# Patient Record
Sex: Male | Born: 1940 | Race: White | Hispanic: No | Marital: Married | Smoking: Never smoker
Health system: Southern US, Community
[De-identification: ages and names within clinical notes are randomized; demographics above are authoritative.]

## PROBLEM LIST (undated history)

## (undated) DIAGNOSIS — C679 Malignant neoplasm of bladder, unspecified: Secondary | ICD-10-CM

## (undated) DIAGNOSIS — Z8719 Personal history of other diseases of the digestive system: Secondary | ICD-10-CM

## (undated) DIAGNOSIS — I8390 Asymptomatic varicose veins of unspecified lower extremity: Secondary | ICD-10-CM

## (undated) DIAGNOSIS — I493 Ventricular premature depolarization: Secondary | ICD-10-CM

## (undated) DIAGNOSIS — Z87442 Personal history of urinary calculi: Secondary | ICD-10-CM

## (undated) HISTORY — PX: TRANSURETHRAL RESECTION OF PROSTATE: SHX73

## (undated) HISTORY — PX: ENDOSCOPIC LIGATION SAPHENOUS VEIN: SUR441

## (undated) HISTORY — PX: COLONOSCOPY: SHX174

## (undated) HISTORY — PX: TONSILLECTOMY AND ADENOIDECTOMY: SUR1326

---

## 2002-05-29 HISTORY — PX: HERNIA REPAIR: SHX51

## 2017-11-14 ENCOUNTER — Emergency Department (HOSPITAL_COMMUNITY): Payer: Medicare Other | Admitting: Anesthesiology

## 2017-11-14 ENCOUNTER — Encounter (HOSPITAL_COMMUNITY): Payer: Self-pay | Admitting: Emergency Medicine

## 2017-11-14 ENCOUNTER — Emergency Department (HOSPITAL_COMMUNITY): Payer: Medicare Other

## 2017-11-14 ENCOUNTER — Encounter (HOSPITAL_COMMUNITY)
Admission: EM | Disposition: A | Discharge status: Home / Self Care | Payer: Self-pay | Source: Home / Self Care | Attending: Emergency Medicine

## 2017-11-14 ENCOUNTER — Emergency Department (HOSPITAL_COMMUNITY)
Admission: EM | Admit: 2017-11-14 | Discharge: 2017-11-14 | Disposition: A | Discharge status: Home / Self Care | Payer: Medicare Other | Attending: Emergency Medicine | Admitting: Emergency Medicine

## 2017-11-14 DIAGNOSIS — Z79899 Other long term (current) drug therapy: Secondary | ICD-10-CM | POA: Insufficient documentation

## 2017-11-14 DIAGNOSIS — N2889 Other specified disorders of kidney and ureter: Secondary | ICD-10-CM | POA: Diagnosis present

## 2017-11-14 DIAGNOSIS — N132 Hydronephrosis with renal and ureteral calculous obstruction: Secondary | ICD-10-CM

## 2017-11-14 HISTORY — PX: CYSTOSCOPY W/ URETERAL STENT PLACEMENT: SHX1429

## 2017-11-14 HISTORY — DX: Malignant neoplasm of bladder, unspecified: C67.9

## 2017-11-14 LAB — COMPREHENSIVE METABOLIC PANEL
ALT: 18 U/L (ref 17–63)
AST: 24 U/L (ref 15–41)
Albumin: 4.6 g/dL (ref 3.5–5.0)
Alkaline Phosphatase: 66 U/L (ref 38–126)
Anion gap: 12 (ref 5–15)
BUN: 24 mg/dL — AB (ref 6–20)
CHLORIDE: 101 mmol/L (ref 101–111)
CO2: 28 mmol/L (ref 22–32)
CREATININE: 1.13 mg/dL (ref 0.61–1.24)
Calcium: 9.3 mg/dL (ref 8.9–10.3)
GFR calc Af Amer: >60 mL/min (ref >60)
GFR calc non Af Amer: >60 mL/min (ref >60)
Glucose, Bld: 122 mg/dL — ABNORMAL HIGH (ref 65–99)
POTASSIUM: 5 mmol/L (ref 3.5–5.1)
SODIUM: 141 mmol/L (ref 135–145)
Total Bilirubin: 1.9 mg/dL — ABNORMAL HIGH (ref 0.3–1.2)
Total Protein: 7.2 g/dL (ref 6.5–8.1)

## 2017-11-14 LAB — CBC WITH DIFFERENTIAL/PLATELET
BASOS ABS: 0 10*3/uL (ref 0.0–0.1)
Basophils Relative: 0 %
EOS ABS: 0 10*3/uL (ref 0.0–0.7)
EOS PCT: 1 %
HCT: 49.9 % (ref 39.0–52.0)
Hemoglobin: 17.3 g/dL — ABNORMAL HIGH (ref 13.0–17.0)
LYMPHS PCT: 7 %
Lymphs Abs: 0.5 10*3/uL — ABNORMAL LOW (ref 0.7–4.0)
MCH: 33.2 pg (ref 26.0–34.0)
MCHC: 34.7 g/dL (ref 30.0–36.0)
MCV: 95.8 fL (ref 78.0–100.0)
Monocytes Absolute: 1 10*3/uL (ref 0.1–1.0)
Monocytes Relative: 14 %
Neutro Abs: 5.7 10*3/uL (ref 1.7–7.7)
Neutrophils Relative %: 78 %
PLATELETS: 193 10*3/uL (ref 150–400)
RBC: 5.21 MIL/uL (ref 4.22–5.81)
RDW: 13.5 % (ref 11.5–15.5)
WBC: 7.2 10*3/uL (ref 4.0–10.5)

## 2017-11-14 LAB — URINALYSIS, ROUTINE W REFLEX MICROSCOPIC
BILIRUBIN URINE: NEGATIVE
Bacteria, UA: NONE SEEN
GLUCOSE, UA: NEGATIVE mg/dL
Ketones, ur: 5 mg/dL — AB
Nitrite: NEGATIVE
PH: 5 (ref 5.0–8.0)
PROTEIN: NEGATIVE mg/dL
Specific Gravity, Urine: 1.013 (ref 1.005–1.030)

## 2017-11-14 LAB — LIPASE, BLOOD: Lipase: 111 U/L — ABNORMAL HIGH (ref 11–51)

## 2017-11-14 SURGERY — CYSTOSCOPY, FLEXIBLE, WITH STENT REPLACEMENT
Anesthesia: General | Laterality: Right

## 2017-11-14 MED ORDER — OXYCODONE HCL 5 MG PO CAPS: 5.0000 mg | ORAL_CAPSULE | ORAL | 0 refills | Status: DC | PRN

## 2017-11-14 MED ORDER — SODIUM CHLORIDE 0.9 % IR SOLN
Status: DC | PRN
  Administered 2017-11-14: 3000 mL via INTRAVESICAL

## 2017-11-14 MED ORDER — CEFAZOLIN SODIUM-DEXTROSE 2-3 GM-%(50ML) IV SOLR
INTRAVENOUS | Status: DC | PRN
  Administered 2017-11-14: 2 g via INTRAVENOUS

## 2017-11-14 MED ORDER — PHENYLEPHRINE 40 MCG/ML (10ML) SYRINGE FOR IV PUSH (FOR BLOOD PRESSURE SUPPORT)
PREFILLED_SYRINGE | INTRAVENOUS | Status: AC
  Filled 2017-11-14: qty 10

## 2017-11-14 MED ORDER — IOHEXOL 300 MG/ML  SOLN
INTRAMUSCULAR | Status: DC | PRN
  Administered 2017-11-14: 40 mL via URETHRAL

## 2017-11-14 MED ORDER — LIDOCAINE 2% (20 MG/ML) 5 ML SYRINGE
INTRAMUSCULAR | Status: DC | PRN
  Administered 2017-11-14: 80 mg via INTRAVENOUS

## 2017-11-14 MED ORDER — ONDANSETRON HCL 4 MG/2ML IJ SOLN
INTRAMUSCULAR | Status: DC | PRN
  Administered 2017-11-14: 4 mg via INTRAVENOUS

## 2017-11-14 MED ORDER — DEXAMETHASONE SODIUM PHOSPHATE 4 MG/ML IJ SOLN
INTRAMUSCULAR | Status: DC | PRN
  Administered 2017-11-14: 10 mg via INTRAVENOUS

## 2017-11-14 MED ORDER — SUCCINYLCHOLINE CHLORIDE 200 MG/10ML IV SOSY
PREFILLED_SYRINGE | INTRAVENOUS | Status: DC | PRN
  Administered 2017-11-14 (×2): 100 mg via INTRAVENOUS

## 2017-11-14 MED ORDER — SUCCINYLCHOLINE CHLORIDE 200 MG/10ML IV SOSY
PREFILLED_SYRINGE | INTRAVENOUS | Status: AC
  Filled 2017-11-14: qty 10

## 2017-11-14 MED ORDER — SODIUM CHLORIDE 0.9 % IV BOLUS
500.0000 mL | Freq: Once | INTRAVENOUS | Status: AC
  Administered 2017-11-14: 500 mL via INTRAVENOUS

## 2017-11-14 MED ORDER — PROPOFOL 10 MG/ML IV BOLUS
INTRAVENOUS | Status: AC
  Filled 2017-11-14: qty 20

## 2017-11-14 MED ORDER — CEFAZOLIN SODIUM-DEXTROSE 2-4 GM/100ML-% IV SOLN
INTRAVENOUS | Status: AC
  Filled 2017-11-14: qty 100

## 2017-11-14 MED ORDER — PROPOFOL 10 MG/ML IV BOLUS
INTRAVENOUS | Status: DC | PRN
  Administered 2017-11-14: 120 mg via INTRAVENOUS
  Administered 2017-11-14: 80 mg via INTRAVENOUS

## 2017-11-14 MED ORDER — PHENYLEPHRINE 40 MCG/ML (10ML) SYRINGE FOR IV PUSH (FOR BLOOD PRESSURE SUPPORT)
PREFILLED_SYRINGE | INTRAVENOUS | Status: DC | PRN
  Administered 2017-11-14 (×3): 80 ug via INTRAVENOUS

## 2017-11-14 MED ORDER — FENTANYL CITRATE (PF) 100 MCG/2ML IJ SOLN
25.0000 ug | INTRAMUSCULAR | Status: DC | PRN
  Administered 2017-11-14 (×2): 50 ug via INTRAVENOUS

## 2017-11-14 MED ORDER — LIDOCAINE 2% (20 MG/ML) 5 ML SYRINGE
INTRAMUSCULAR | Status: AC
  Filled 2017-11-14: qty 5

## 2017-11-14 MED ORDER — PROMETHAZINE HCL 25 MG/ML IJ SOLN: 6.2500 mg | INTRAMUSCULAR | Status: DC | PRN

## 2017-11-14 MED ORDER — LACTATED RINGERS IV SOLN
INTRAVENOUS | Status: DC | PRN
  Administered 2017-11-14: 21:00:00 via INTRAVENOUS

## 2017-11-14 MED ORDER — FENTANYL CITRATE (PF) 100 MCG/2ML IJ SOLN
INTRAMUSCULAR | Status: AC
  Filled 2017-11-14: qty 2

## 2017-11-14 MED ORDER — ACETAMINOPHEN 10 MG/ML IV SOLN: 1000.0000 mg | Freq: Once | INTRAVENOUS | Status: DC | PRN

## 2017-11-14 MED ORDER — OXYCODONE HCL 5 MG/5ML PO SOLN
5.0000 mg | Freq: Once | ORAL | Status: DC | PRN
  Filled 2017-11-14: qty 5

## 2017-11-14 MED ORDER — OXYCODONE HCL 5 MG PO TABS: 5.0000 mg | ORAL_TABLET | Freq: Once | ORAL | Status: DC | PRN

## 2017-11-14 MED ORDER — ONDANSETRON HCL 4 MG/2ML IJ SOLN
INTRAMUSCULAR | Status: AC
  Filled 2017-11-14: qty 2

## 2017-11-14 MED ORDER — DEXAMETHASONE SODIUM PHOSPHATE 10 MG/ML IJ SOLN
INTRAMUSCULAR | Status: AC
  Filled 2017-11-14: qty 1

## 2017-11-14 SURGICAL SUPPLY — 14 items
BAG URO CATCHER STRL LF (MISCELLANEOUS) ×3 IMPLANT
CATH INTERMIT  6FR 70CM (CATHETERS) ×3 IMPLANT
CLOTH BEACON ORANGE TIMEOUT ST (SAFETY) IMPLANT
COVER FOOTSWITCH UNIV (MISCELLANEOUS) IMPLANT
COVER SURGICAL LIGHT HANDLE (MISCELLANEOUS) IMPLANT
GLOVE BIOGEL M STRL SZ7.5 (GLOVE) ×9 IMPLANT
GOWN STRL REUS W/TWL LRG LVL3 (GOWN DISPOSABLE) ×6 IMPLANT
GUIDEWIRE STR DUAL SENSOR (WIRE) ×6 IMPLANT
GUIDEWIRE ZIPWRE .038 STRAIGHT (WIRE) ×3 IMPLANT
MANIFOLD NEPTUNE II (INSTRUMENTS) ×3 IMPLANT
PACK CYSTO (CUSTOM PROCEDURE TRAY) ×3 IMPLANT
STENT CONTOUR 6FRX26X.038 (STENTS) ×3 IMPLANT
TUBING CONNECTING 10 (TUBING) ×2 IMPLANT
TUBING CONNECTING 10' (TUBING) ×1

## 2017-11-14 NOTE — Anesthesia Postprocedure Evaluation (Signed)
Anesthesia Post Note  Patient: Peter Lin  Procedure(s) Performed: CYSTOSCOPY WITH STENT REPLACEMENT/RETROGRADE (Right )     Patient location during evaluation: PACU Anesthesia Type: General Level of consciousness: awake and alert Pain management: pain level controlled Vital Signs Assessment: post-procedure vital signs reviewed and stable Respiratory status: spontaneous breathing, nonlabored ventilation, respiratory function stable and patient connected to nasal cannula oxygen Cardiovascular status: blood pressure returned to baseline and stable Postop Assessment: no apparent nausea or vomiting Anesthetic complications: no    Last Vitals:  Vitals:   11/14/17 2000 11/14/17 2155  BP: (!) 141/73 138/86  Pulse: 98 90  Resp: 18 13  Temp:  (!) 36.4 C  SpO2: 96% 100%    Last Pain:  Vitals:   11/14/17 1747  TempSrc:   PainSc: 4                  Tameeka Luo S

## 2017-11-14 NOTE — Discharge Instructions (Signed)
General Anesthesia, Adult, Care After °These instructions provide you with information about caring for yourself after your procedure. Your health care provider may also give you more specific instructions. Your treatment has been planned according to current medical practices, but problems sometimes occur. Call your health care provider if you have any problems or questions after your procedure. °What can I expect after the procedure? °After the procedure, it is common to have: °· Vomiting. °· A sore throat. °· Mental slowness. ° °It is common to feel: °· Nauseous. °· Cold or shivery. °· Sleepy. °· Tired. °· Sore or achy, even in parts of your body where you did not have surgery. ° °Follow these instructions at home: °For at least 24 hours after the procedure: °· Do not: °? Participate in activities where you could fall or become injured. °? Drive. °? Use heavy machinery. °? Drink alcohol. °? Take sleeping pills or medicines that cause drowsiness. °? Make important decisions or sign legal documents. °? Take care of children on your own. °· Rest. °Eating and drinking °· If you vomit, drink water, juice, or soup when you can drink without vomiting. °· Drink enough fluid to keep your urine clear or pale yellow. °· Make sure you have little or no nausea before eating solid foods. °· Follow the diet recommended by your health care provider. °General instructions °· Have a responsible adult stay with you until you are awake and alert. °· Return to your normal activities as told by your health care provider. Ask your health care provider what activities are safe for you. °· Take over-the-counter and prescription medicines only as told by your health care provider. °· If you smoke, do not smoke without supervision. °· Keep all follow-up visits as told by your health care provider. This is important. °Contact a health care provider if: °· You continue to have nausea or vomiting at home, and medicines are not helpful. °· You  cannot drink fluids or start eating again. °· You cannot urinate after 8-12 hours. °· You develop a skin rash. °· You have fever. °· You have increasing redness at the site of your procedure. °Get help right away if: °· You have difficulty breathing. °· You have chest pain. °· You have unexpected bleeding. °· You feel that you are having a life-threatening or urgent problem. °This information is not intended to replace advice given to you by your health care provider. Make sure you discuss any questions you have with your health care provider. °Document Released: 08/21/2000 Document Revised: 10/18/2015 Document Reviewed: 04/29/2015 °Elsevier Interactive Patient Education © 2018 [employer] ° °

## 2017-11-14 NOTE — Anesthesia Preprocedure Evaluation (Signed)
Anesthesia Evaluation  Patient identified by MRN, date of birth, ID band Patient awake    Reviewed: Allergy & Precautions, NPO status , Patient's Chart, lab work & pertinent test results  Airway Mallampati: II  TM Distance: >3 FB Neck ROM: Full    Dental no notable dental hx.    Pulmonary neg pulmonary ROS,    Pulmonary exam normal breath sounds clear to auscultation       Cardiovascular negative cardio ROS Normal cardiovascular exam Rhythm:Regular Rate:Normal     Neuro/Psych negative neurological ROS  negative psych ROS   GI/Hepatic negative GI ROS, Neg liver ROS,   Endo/Other  negative endocrine ROS  Renal/GU Renal diseaseB hydronephrosis  negative genitourinary   Musculoskeletal negative musculoskeletal ROS (+)   Abdominal   Peds negative pediatric ROS (+)  Hematology negative hematology ROS (+)   Anesthesia Other Findings   Reproductive/Obstetrics negative OB ROS                             Anesthesia Physical Anesthesia Plan  ASA: II  Anesthesia Plan: General   Post-op Pain Management:    Induction: Intravenous  PONV Risk Score and Plan: 2 and Ondansetron, Dexamethasone and Treatment may vary due to age or medical condition  Airway Management Planned: LMA and Oral ETT  Additional Equipment:   Intra-op Plan:   Post-operative Plan: Extubation in OR  Informed Consent: I have reviewed the patients History and Physical, chart, labs and discussed the procedure including the risks, benefits and alternatives for the proposed anesthesia with the patient or authorized representative who has indicated his/her understanding and acceptance.   Dental advisory given  Plan Discussed with: CRNA and Surgeon  Anesthesia Plan Comments:         Anesthesia Quick Evaluation

## 2017-11-14 NOTE — Consult Note (Signed)
Urology Consult Note   Requesting Attending Physician:  Dorie Rank, MD Service Providing Consult: Urology  Consulting Attending: Alinda Money   Reason for Consult:  Bilateral obstructing stones  HPI: Peter Lin is seen in consultation for reasons noted above at the request of Dorie Rank, MD for evaluation of Bilateral obstructing stones.  Acute onset right flank pain at 2am. Severe and intermittent throughout the day. + Nausea. No fever, chills, or emesis.   In ED, VSS. Afebrile  WBC - 7.2 Cr - 1.13  U/A - negative nitrites trace LE, 11-20 WBC, >50 rbc  CT revealed bilateral hydronephrosis with bilateral obstructing stones, 41mm right mid ureter, 10mm left proximal ureter. Enlarged prostate. Also, 11 mm hemorrhagic cyst versus solid mass in the left upper pole  Hx of LgTa s/p TURBT. No hx of intravesical therapy. Also hx of BPH s/p TURP. No prior kidney stones.   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 He is in town from [place] visiting family.   Past Medical History: Past Medical History:  Diagnosis Date  . Bladder cancer [facility])     Past Surgical History:  Past Surgical History:  Procedure Laterality Date  . HERNIA REPAIR Left 2004   Inginal    Medication: No current facility-administered medications for this encounter.    Current Outpatient Medications  Medication Sig Dispense Refill  . Cholecalciferol (VITAMIN D PO) Take 1 tablet by mouth daily.    . Cyanocobalamin (VITAMIN B 12 PO) Take 1 tablet by mouth daily.    . naproxen sodium (ALEVE) 220 MG tablet Take 220 mg by mouth daily as needed (pain).      Allergies: No Known Allergies  Social History: Social History   Tobacco Use  . Smoking status: Not on file  Substance Use Topics  . Alcohol use: Not on file  . Drug use: Not on file    Family History No family history on file.  Review of Systems 10 systems were reviewed and are negative except as noted specifically in the HPI.  Objective   Vital signs in last  24 hours: BP 139/88   Pulse 78   Temp (!) 96.4 F (35.8 C) (Axillary)   Resp 18   SpO2 98%   Physical Exam General: NAD, A&O, resting, appropriate HEENT: [place]/AT, EOMI, MMM Pulmonary: Normal work of breathing Cardiovascular: HDS, adequate peripheral perfusion Abdomen: Soft, NTTP, nondistended, . GU: right CVA tenderness Extremities: warm and well perfused Neuro: Appropriate, no focal neurological deficits  Most Recent Labs: Lab Results  Component Value Date   WBC 7.2 11/14/2017   HGB 17.3 (H) 11/14/2017   HCT 49.9 11/14/2017   PLT 193 11/14/2017    Lab Results  Component Value Date   NA 141 11/14/2017   K 5.0 11/14/2017   CL 101 11/14/2017   CO2 28 11/14/2017   BUN 24 (H) 11/14/2017   CREATININE 1.13 11/14/2017   CALCIUM 9.3 11/14/2017    No results found for: INR, APTT   IMAGING: Ct Renal Stone Study  Result Date: 11/14/2017 CLINICAL DATA:  Flank pain with stone disease suspected EXAM: CT ABDOMEN AND PELVIS WITHOUT CONTRAST TECHNIQUE: Multidetector CT imaging of the abdomen and pelvis was performed following the standard protocol without IV contrast. COMPARISON:  None. FINDINGS: Lower chest:  Mild atelectasis at the bases. Hepatobiliary: No focal liver abnormality.No evidence of biliary obstruction or stone. Pancreas: Unremarkable. Spleen: Unremarkable. Adrenals/Urinary Tract: Negative adrenals. Left more than right hydronephrosis above bilateral ureteral stones. Left-sided stone measures 7 x 4  mm on coronal reformats and is seen at the level of the L3 vertebra. Right-sided stone measures 6 x 4 mm on reformats and is seen at the level of the L3-4 disc space. At least 5 right and 3 left intrarenal calculi, on the left measuring up to 9 mm. Simple appearing left renal cyst. There is a 11 mm high-density left upper pole renal lesion that is most likely a hemorrhagic cyst, solid mass not excluded. Circumferential bladder wall thickening with at least 2 diverticula.  Stomach/Bowel:  No obstruction. No inflammatory changes. Vascular/Lymphatic: No acute vascular abnormality. Atherosclerotic calcification. No mass or adenopathy. Reproductive:Symmetric prostate enlargement Other: No ascites or pneumoperitoneum. Musculoskeletal: No acute abnormalities. IMPRESSION: 1. Bilateral hydronephrosis due to upper ureteral stones measuring 6 x 4 mm on the right and 7 x 4 mm on the left. 2. Multiple bilateral intrarenal calculi. 3. 11 mm hemorrhagic cyst versus solid mass in the left upper pole. 4. 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 No over distention of the bladder currently. Electronically Signed   By: Monte Fantasia M.D.   On: 11/14/2017 18:33    ------  Assessment:  77 y.o. male with bilateral hydronephrosis and obstructing stones, right - 76mm mid ureter  Left - 21mm proximal ureter. Discussed options with Peter Lin, including stent placement vs primary ureteoscopy. We discussed the associated risks and benefits of each option .  Peter Lin has opted for ureteral stent placement. We will proceed with stent placement this evening. Since Peter Lin is only in town for a short period of time, he will follow up for definitive stone treatment with his urologist at home in [place]   Recommendations: - NPO  - [place] with bilateral ureteral stent placement and retrograde pyelograms tonight .   Thank you for this consult. Please contact the urology consult pager with any further questions/concerns.

## 2017-11-14 NOTE — ED Provider Notes (Signed)
[facility] DEPT Provider Note   CSN: [id number] Arrival date & time: 11/14/17  1656     History   Chief Complaint No chief complaint on file.   HPI Peter Lin is a 77 y.o. male.  HPI   Peter Lin is a 77 y.o. male, with a history of bladder cancer, presenting to the ED with right lower back pain beginning around 3 AM this morning.  Pain migrated to the right flank and into the right lower quadrant.  Accompanied by diaphoresis and dry heaving.  Pain initially severe, but now rated 3/10, stabbing, waxing and waning.  He has not had this pain before. Denies vomiting, cough, fever, chest pain, shortness of breath, difficulty urinating, hematuria, dysuria, diarrhea, penile or testicular pain, or any other complaints.     Past Medical History:  Diagnosis Date  . Bladder cancer ([place])     There are no active problems to display for this patient.   Past Surgical History:  Procedure Laterality Date  . HERNIA REPAIR Left 2004   Inginal        Home Medications    Prior to Admission medications   Medication Sig Start Date End Date Taking? Authorizing Provider  Cholecalciferol (VITAMIN D PO) Take 1 tablet by mouth daily.   Yes [provider]  Cyanocobalamin (VITAMIN B 12 PO) Take 1 tablet by mouth daily.   Yes [provider]  naproxen sodium (ALEVE) 220 MG tablet Take 220 mg by mouth daily as needed (pain).   Yes [provider]    Family History No family history on file.  Social History Social History   Tobacco Use  . Smoking status: Not on file  Substance Use Topics  . Alcohol use: Not on file  . Drug use: Not on file     Allergies   Patient has no known allergies.   Review of Systems Review of Systems  Constitutional: Positive for diaphoresis. Negative for chills and fever.  Respiratory: Negative for cough and shortness of breath.   Cardiovascular: Negative for chest pain.  Gastrointestinal:  Positive for nausea. Negative for blood in stool, diarrhea and vomiting.  Genitourinary: Positive for flank pain. Negative for dysuria, hematuria and testicular pain.  Musculoskeletal: Positive for back pain.  All other systems reviewed and are negative.    Physical Exam Updated Vital Signs BP (!) 179/103 (BP Location: Right Arm)   Pulse 88   Resp 14   SpO2 94%   Physical Exam  Constitutional: He appears well-developed and well-nourished. No distress.  HENT:  Head: Normocephalic and atraumatic.  Eyes: Conjunctivae are normal.  Neck: Neck supple.  Cardiovascular: Normal rate, regular rhythm, normal heart sounds and intact distal pulses.  Pulmonary/Chest: Effort normal and breath sounds normal. No respiratory distress.  Abdominal: Soft. Bowel sounds are normal. There is no tenderness. There is no guarding and no CVA tenderness.  Musculoskeletal: He exhibits no edema.  Lymphadenopathy:    He has no cervical adenopathy.  Neurological: He is alert.  Skin: Skin is warm and dry. He is not diaphoretic.  Psychiatric: He has a normal mood and affect. His behavior is normal.  Nursing note and vitals reviewed.    ED Treatments / Results  Labs (all labs ordered are listed, but only abnormal results are displayed) Labs Reviewed  CBC WITH DIFFERENTIAL/PLATELET - Abnormal; Notable for the following components:      Result Value   Hemoglobin 17.3 (*)    Lymphs Abs 0.5 (*)  All other components within normal limits  COMPREHENSIVE METABOLIC PANEL - Abnormal; Notable for the following components:   Glucose, Bld 122 (*)    BUN 24 (*)    Total Bilirubin 1.9 (*)    All other components within normal limits  LIPASE, BLOOD - Abnormal; Notable for the following components:   Lipase 111 (*)    All other components within normal limits  URINALYSIS, ROUTINE W REFLEX MICROSCOPIC - Abnormal; Notable for the following components:   Hgb urine dipstick LARGE (*)    Ketones, ur 5 (*)     Leukocytes, UA TRACE (*)    RBC / HPF >50 (*)    All other components within normal limits    EKG None  Radiology Ct Renal Stone Study  Result Date: 11/14/2017 CLINICAL DATA:  Flank pain with stone disease suspected EXAM: CT ABDOMEN AND PELVIS WITHOUT CONTRAST TECHNIQUE: Multidetector CT imaging of the abdomen and pelvis was performed following the standard protocol without IV contrast. COMPARISON:  None. FINDINGS: Lower chest:  Mild atelectasis at the bases. Hepatobiliary: No focal liver abnormality.No evidence of biliary obstruction or stone. Pancreas: Unremarkable. Spleen: Unremarkable. Adrenals/Urinary Tract: Negative adrenals. Left more than right hydronephrosis above bilateral ureteral stones. Left-sided stone measures 7 x 4 mm on coronal reformats and is seen at the level of the L3 vertebra. Right-sided stone measures 6 x 4 mm on reformats and is seen at the level of the L3-4 disc space. At least 5 right and 3 left intrarenal calculi, on the left measuring up to 9 mm. Simple appearing left renal cyst. There is a 11 mm high-density left upper pole renal lesion that is most likely a hemorrhagic cyst, solid mass not excluded. Circumferential bladder wall thickening with at least 2 diverticula. Stomach/Bowel:  No obstruction. No inflammatory changes. Vascular/Lymphatic: No acute vascular abnormality. Atherosclerotic calcification. No mass or adenopathy. Reproductive:Symmetric prostate enlargement Other: No ascites or pneumoperitoneum. Musculoskeletal: No acute abnormalities. IMPRESSION: 1. Bilateral hydronephrosis due to upper ureteral stones measuring 6 x 4 mm on the right and 7 x 4 mm on the left. 2. Multiple bilateral intrarenal calculi. 3. 11 mm hemorrhagic cyst versus solid mass in the left upper pole. 4. 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 No over distention of the bladder currently. Electronically Signed   By: Monte Fantasia M.D.   On: 11/14/2017 18:33    Procedures Procedures (including critical care time)  Medications Ordered in ED Medications  sodium chloride 0.9 % bolus 500 mL (0 mLs Intravenous Stopped 11/14/17 1841)     Initial Impression / Assessment and Plan / ED Course  I have reviewed the triage vital signs and the nursing notes.  Pertinent labs & imaging results that were available during my care of the patient were reviewed by me and considered in my medical decision making (see chart for details).  Clinical Course as of Nov 14 2013  Wed Nov 14, 2017  1720 Declines pain management at initial interview.   [SJ]  [id number] Patient continues to voice waxing and waning pain, however, has not worsened.  Continues to decline pain management.   [SJ]  1907 Discussed imaging findings with the patient.  Patient is visiting from [place], therefore does not have a local urologist.   [SJ]  1910 No pain or tenderness in right upper quadrant or epigastric regions.  No noted pancreatic abnormalities on CT scan.  Lipase(!): 111 [SJ]  1924 Spoke with Fredrik Rigger, urology  resident. Keep patient NPO. No NSAIDS. He will come see the patient shortly.    [SJ]    Clinical Course User Index [SJ] Heather Streeper C, PA-C    Patient presents with right lower back and flank pain, suspicion for renal colic.  Evidence of bilateral ureter stones with bilateral hydronephrosis noted on CT.  Creatinine within normal range, BUN mildly elevated.  Patient is nontoxic appearing, afebrile, not tachycardic, not tachypneic, not hypotensive. Appears mildly uncomfortable, however, declined pain management.  Patient evaluated by urology resident and decision was made to take patient to the OR.   Findings and plan of care discussed with Dorie Rank, MD. Dr. Tomi Bamberger personally evaluated and examined this patient.  Vitals:   11/14/17 1709 11/14/17 1742 11/14/17 1930 11/14/17 2000  BP: (!) 179/103  139/88 (!) 141/73  Pulse: 88  78 98    Resp: 14  18 18   Temp:  (!) 96.4 F (35.8 C)    TempSrc:  Axillary    SpO2: 94%  98% 96%     Final Clinical Impressions(s) / ED Diagnoses   Final diagnoses:  Ureteral stone with hydronephrosis  Left kidney mass    ED Discharge Orders    None       Layla Maw 11/14/17 2016    Dorie Rank, MD 11/14/17 2314

## 2017-11-14 NOTE — ED Provider Notes (Signed)
Medical screening examination/treatment/procedure(s) were conducted as a shared visit with non-physician practitioner(s) and myself.  I personally evaluated the patient during the encounter.  Pt presented with flank pain and nausea.  Sx concerning for ureteral colic.  CT confirms kidney stones .  Unfortunately large in size, 7 and 6 mm.  May not pass on their own.  Plan on discussing with urology.  Pt does not want anything for pain at this time.   Dorie Rank, MD 11/14/17 1949

## 2017-11-14 NOTE — Transfer of Care (Signed)
Immediate Anesthesia Transfer of Care Note  Patient: Peter Lin  Procedure(s) Performed: CYSTOSCOPY WITH STENT REPLACEMENT/RETROGRADE (Right )  Patient Location: PACU  Anesthesia Type:General  Level of Consciousness: awake, alert , oriented and patient cooperative  Airway & Oxygen Therapy: Patient Spontanous Breathing and Patient connected to face mask  Post-op Assessment: Report given to RN and Post -op Vital signs reviewed and stable  Post vital signs: Reviewed and stable  Last Vitals:  Vitals Value Taken Time  BP 138/86 11/14/2017  9:55 PM  Temp    Pulse 91 11/14/2017  9:56 PM  Resp 13 11/14/2017  9:56 PM  SpO2 100 % 11/14/2017  9:56 PM  Vitals shown include unvalidated device data.  Last Pain:  Vitals:   11/14/17 1747  TempSrc:   PainSc: 4          Complications: No apparent anesthesia complications

## 2017-11-14 NOTE — ED Triage Notes (Signed)
Pt c/o right flank pain. Started at 0200 this am. Denies urinary issues. Noted some dry heaving when the pain started. Pt was able to eat lunch without issue. No hx of kidney stones.

## 2017-11-14 NOTE — H&P (View-Only) (Signed)
Urology Consult Note   Requesting Attending Physician:  Dorie Rank, MD Service Providing Consult: Urology  Consulting Attending: Alinda Money   Reason for Consult:  Bilateral obstructing stones  HPI: Peter Lin is seen in consultation for reasons noted above at the request of Dorie Rank, MD for evaluation of Bilateral obstructing stones.  Acute onset right flank pain at 2am. Severe and intermittent throughout the day. + Nausea. No fever, chills, or emesis.   In ED, VSS. Afebrile  WBC - 7.2 Cr - 1.13  U/A - negative nitrites trace LE, 11-20 WBC, >50 rbc  CT revealed bilateral hydronephrosis with bilateral obstructing stones, 68mm right mid ureter, 22mm left proximal ureter. Enlarged prostate. Also, 11 mm hemorrhagic cyst versus solid mass in the left upper pole  Hx of LgTa s/p TURBT. No hx of intravesical therapy. Also hx of BPH s/p TURP. No prior kidney stones.   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 He is in town from [place] visiting family.   Past Medical History: Past Medical History:  Diagnosis Date  . 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 HERNIA REPAIR Left 2004   Inginal    Medication: No current facility-administered medications for this encounter.    Current Outpatient Medications  Medication Sig Dispense Refill  . Cholecalciferol (VITAMIN D PO) Take 1 tablet by mouth daily.    . Cyanocobalamin (VITAMIN B 12 PO) Take 1 tablet by mouth daily.    . naproxen sodium (ALEVE) 220 MG tablet Take 220 mg by mouth daily as needed (pain).      Allergies: No Known Allergies  Social History: Social History   Tobacco Use  . Smoking status: Not on file  Substance Use Topics  . Alcohol use: Not on file  . Drug use: Not on file    Family History No family history on file.  Review of Systems 10 systems were reviewed and are negative except as noted specifically in the HPI.  Objective   Vital signs in last  24 hours: BP 139/88   Pulse 78   Temp (!) 96.4 F (35.8 C) (Axillary)   Resp 18   SpO2 98%   Physical Exam General: NAD, A&O, resting, appropriate HEENT: [place]/AT, EOMI, MMM Pulmonary: Normal work of breathing Cardiovascular: HDS, adequate peripheral perfusion Abdomen: Soft, NTTP, nondistended, . GU: right CVA tenderness Extremities: warm and well perfused Neuro: Appropriate, no focal neurological deficits  Most Recent Labs: Lab Results  Component Value Date   WBC 7.2 11/14/2017   HGB 17.3 (H) 11/14/2017   HCT 49.9 11/14/2017   PLT 193 11/14/2017    Lab Results  Component Value Date   NA 141 11/14/2017   K 5.0 11/14/2017   CL 101 11/14/2017   CO2 28 11/14/2017   BUN 24 (H) 11/14/2017   CREATININE 1.13 11/14/2017   CALCIUM 9.3 11/14/2017    No results found for: INR, APTT   IMAGING: Ct Renal Stone Study  Result Date: 11/14/2017 CLINICAL DATA:  Flank pain with stone disease suspected EXAM: CT ABDOMEN AND PELVIS WITHOUT CONTRAST TECHNIQUE: Multidetector CT imaging of the abdomen and pelvis was performed following the standard protocol without IV contrast. COMPARISON:  None. FINDINGS: Lower chest:  Mild atelectasis at the bases. Hepatobiliary: No focal liver abnormality.No evidence of biliary obstruction or stone. Pancreas: Unremarkable. Spleen: Unremarkable. Adrenals/Urinary Tract: Negative adrenals. Left more than right hydronephrosis above bilateral ureteral stones. Left-sided stone measures 7 x 4  mm on coronal reformats and is seen at the level of the L3 vertebra. Right-sided stone measures 6 x 4 mm on reformats and is seen at the level of the L3-4 disc space. At least 5 right and 3 left intrarenal calculi, on the left measuring up to 9 mm. Simple appearing left renal cyst. There is a 11 mm high-density left upper pole renal lesion that is most likely a hemorrhagic cyst, solid mass not excluded. Circumferential bladder wall thickening with at least 2 diverticula.  Stomach/Bowel:  No obstruction. No inflammatory changes. Vascular/Lymphatic: No acute vascular abnormality. Atherosclerotic calcification. No mass or adenopathy. Reproductive:Symmetric prostate enlargement Other: No ascites or pneumoperitoneum. Musculoskeletal: No acute abnormalities. IMPRESSION: 1. Bilateral hydronephrosis due to upper ureteral stones measuring 6 x 4 mm on the right and 7 x 4 mm on the left. 2. Multiple bilateral intrarenal calculi. 3. 11 mm hemorrhagic cyst versus solid mass in the left upper pole. 4. 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 No over distention of the bladder currently. Electronically Signed   By: Monte Fantasia M.D.   On: 11/14/2017 18:33    ------  Assessment:  77 y.o. male with bilateral hydronephrosis and obstructing stones, right - 68mm mid ureter  Left - 62mm proximal ureter. Discussed options with Peter Lin, including stent placement vs primary ureteoscopy. We discussed the associated risks and benefits of each option .  Peter Lin has opted for ureteral stent placement. We will proceed with stent placement this evening. Since [name] is only in town for a short period of time, he will follow up for definitive stone treatment with his urologist at home in [place]   Recommendations: - NPO  - [place] with bilateral ureteral stent placement and retrograde pyelograms tonight .   Thank you for this consult. Please contact the urology consult pager with any further questions/concerns.

## 2017-11-14 NOTE — Anesthesia Procedure Notes (Signed)
Procedure Name: Intubation Date/Time: 11/14/2017 9:10 PM Performed by: Claudia Desanctis, CRNA Pre-anesthesia Checklist: Emergency Drugs available, Suction available, Patient identified and Patient being monitored Patient Re-evaluated:Patient Re-evaluated prior to induction Oxygen Delivery Method: Circle system utilized Preoxygenation: Pre-oxygenation with 100% oxygen Induction Type: IV induction Ventilation: Mask ventilation without difficulty Laryngoscope Size: Glidescope and 3 Grade View: Grade I Tube type: Oral Tube size: 7.5 mm Number of attempts: 1 Airway Equipment and Method: Video-laryngoscopy Placement Confirmation: ETT inserted through vocal cords under direct vision,  positive ETCO2 and breath sounds checked- equal and bilateral Secured at: 22 cm Tube secured with: Tape Dental Injury: Teeth and Oropharynx as per pre-operative assessment  Difficulty Due To: Difficulty was unanticipated and Difficult Airway- due to anterior larynx Comments: Attempted dl with Miller 2, grade 3 view, easy mask before and after attempt, glidescope used, anterior larynx visualized

## 2017-11-14 NOTE — Op Note (Addendum)
Preoperative diagnosis:  1. Bilateral ureteral calculi   Postoperative diagnosis:  1. Bilateral ureteral calculi   Procedure:  1. Cystoscopy 2. Right ureteral stent placement (6 x 26 - no string)  3. Bilateral retrograde pyelography with interpretation  Surgeon: Pryor Curia. M.D.  Anesthesia: General  Complications: None  Intraoperative findings: Bilateral retrograde pyelography was performed with a 6 French ureteral catheter and Omnipaque contrast.  On the right side, there was a normal caliber ureter although it was somewhat tortuous with a J hooking of the distal ureter.  There was a filling defect noted in the proximal ureter consistent with his known calculus.  On the left side, the ureter was of normal caliber also with some tortuosity and J hooking of the distal ureter.  There was a filling defect in the proximal left ureter consistent with his known calculus.  Very little contrast was seen to bypass the proximal left ureteral stone.  EBL: Minimal  Specimens: None  Indication: Peter Lin is a 77 y.o. patient with bilateral ureteral calculi. After reviewing the management options for treatment, he elected to proceed with the above surgical procedure(s). We have discussed the potential benefits and risks of the procedure, side effects of the proposed treatment, the likelihood of the patient achieving the goals of the procedure, and any potential problems that might occur during the procedure or recuperation. Informed consent has been obtained.  Description of procedure:  The patient was taken to the operating room and general anesthesia was induced.  The patient was placed in the dorsal lithotomy position, prepped and draped in the usual sterile fashion, and preoperative antibiotics were administered. A preoperative time-out was performed.   Cystourethroscopy was performed.  The patient's urethra was examined and was normal except for a TUR defect consistent with his prior  surgical history. The bladder was then systematically examined in its entirety. There was no evidence for any bladder tumors, stones, or other mucosal pathology aside from a small diverticulum at the dome and moderate trabeculation throughout the bladder.   Attention then turned to the right ureteral orifice and a ureteral catheter was used to intubate the ureteral orifice.  Omnipaque contrast was injected through the ureteral catheter and a retrograde pyelogram was performed with findings as dictated above.  A 0.38 sensor guidewire was then advanced up the right ureter into the renal pelvis under fluoroscopic guidance.  The wire was then backloaded through the cystoscope and a ureteral stent was advance over the wire using Seldinger technique.  The stent was positioned appropriately under fluoroscopic and cystoscopic guidance.  The wire was then removed with an adequate stent curl noted in the renal pelvis as well as in the bladder.  Attention then turned to the left ureteral orifice and a ureteral catheter was used to intubate the ureteral orifice.  Omnipaque contrast was injected through the ureteral catheter and a retrograde pyelogram was performed with findings as dictated above.  Minimal contrast was seen to bypass the obstructing proximal filling defect.  Attempts to place a sensor guidewire were unsuccessful.  A Glidewire was then obtained and passed through the ureteral catheter.  Again, despite multiple attempts, the Glidewire was unable to bypass the obstructing stone.  After multiple unsuccessful attempts, it was decided to abandon left ureteral stent placement to avoid any potential injury to the ureter.  Considering the fact that he had been asymptomatic on the left side despite more severe hydronephrosis on this side and some thinned parenchyma of the left kidney,  this did raise the likely possibility that he is left ureteral stone was possibly impacted and chronic.  The bladder was then  emptied and the procedure ended.  The patient appeared to tolerate the procedure well and without complications.  The patient was able to be awakened and transferred to the recovery unit in satisfactory condition.    Pryor Curia MD

## 2017-11-15 ENCOUNTER — Encounter (HOSPITAL_COMMUNITY): Payer: Self-pay | Admitting: Urology

## 2017-11-15 ENCOUNTER — Ambulatory Visit (HOSPITAL_COMMUNITY)
Admission: RE | Admit: 2017-11-15 | Discharge: 2017-11-15 | Disposition: A | Discharge status: Home / Self Care | Payer: Medicare Other | Source: Ambulatory Visit | Attending: Urology | Admitting: Urology

## 2017-11-15 ENCOUNTER — Ambulatory Visit (HOSPITAL_COMMUNITY): Payer: Medicare Other

## 2017-11-15 ENCOUNTER — Encounter (HOSPITAL_COMMUNITY)
Admission: RE | Disposition: A | Discharge status: Home / Self Care | Payer: Self-pay | Source: Ambulatory Visit | Attending: Urology

## 2017-11-15 ENCOUNTER — Other Ambulatory Visit: Payer: Self-pay | Admitting: Urology

## 2017-11-15 ENCOUNTER — Other Ambulatory Visit: Payer: Self-pay

## 2017-11-15 DIAGNOSIS — Z5309 Procedure and treatment not carried out because of other contraindication: Secondary | ICD-10-CM | POA: Diagnosis not present

## 2017-11-15 DIAGNOSIS — N201 Calculus of ureter: Secondary | ICD-10-CM | POA: Insufficient documentation

## 2017-11-15 HISTORY — PX: EXTRACORPOREAL SHOCK WAVE LITHOTRIPSY: SHX1557

## 2017-11-15 SURGERY — LITHOTRIPSY, ESWL
Anesthesia: LOCAL | Laterality: Left

## 2017-11-15 MED ORDER — DIPHENHYDRAMINE HCL 25 MG PO CAPS
25.0000 mg | ORAL_CAPSULE | ORAL | Status: AC
  Administered 2017-11-15: 25 mg via ORAL
  Filled 2017-11-15: qty 1

## 2017-11-15 MED ORDER — DIAZEPAM 5 MG PO TABS
10.0000 mg | ORAL_TABLET | ORAL | Status: AC
  Administered 2017-11-15: 10 mg via ORAL
  Filled 2017-11-15: qty 2

## 2017-11-15 MED ORDER — CIPROFLOXACIN HCL 500 MG PO TABS
500.0000 mg | ORAL_TABLET | ORAL | Status: AC
  Administered 2017-11-15: 500 mg via ORAL
  Filled 2017-11-15: qty 1

## 2017-11-15 MED ORDER — SODIUM CHLORIDE 0.9 % IV SOLN
INTRAVENOUS | Status: DC
  Administered 2017-11-15: 13:00:00 via INTRAVENOUS

## 2017-11-15 NOTE — Progress Notes (Signed)
Pt added on today for 1345 litho.  Pt was prepped for litho.  Pt made it to [place] truck and remembered he had aleve yesterday.  So procedure was postponed until Monday November 19, 2017.

## 2017-11-16 ENCOUNTER — Encounter (HOSPITAL_COMMUNITY): Payer: Self-pay | Admitting: Urology

## 2017-11-19 ENCOUNTER — Encounter (HOSPITAL_COMMUNITY)
Admission: RE | Disposition: A | Discharge status: Home / Self Care | Payer: Self-pay | Source: Ambulatory Visit | Attending: Urology

## 2017-11-19 ENCOUNTER — Ambulatory Visit (HOSPITAL_COMMUNITY): Payer: Medicare Other

## 2017-11-19 ENCOUNTER — Ambulatory Visit (HOSPITAL_COMMUNITY)
Admission: RE | Admit: 2017-11-19 | Discharge: 2017-11-19 | Disposition: A | Discharge status: Home / Self Care | Payer: Medicare Other | Source: Ambulatory Visit | Attending: Urology | Admitting: Urology

## 2017-11-19 ENCOUNTER — Other Ambulatory Visit: Payer: Self-pay

## 2017-11-19 DIAGNOSIS — Z8551 Personal history of malignant neoplasm of bladder: Secondary | ICD-10-CM | POA: Insufficient documentation

## 2017-11-19 DIAGNOSIS — N201 Calculus of ureter: Secondary | ICD-10-CM | POA: Diagnosis present

## 2017-11-19 HISTORY — PX: EXTRACORPOREAL SHOCK WAVE LITHOTRIPSY: SHX1557

## 2017-11-19 SURGERY — LITHOTRIPSY, ESWL
Anesthesia: LOCAL | Laterality: Left

## 2017-11-19 MED ORDER — DIPHENHYDRAMINE HCL 25 MG PO CAPS
25.0000 mg | ORAL_CAPSULE | ORAL | Status: AC
  Administered 2017-11-19: 25 mg via ORAL
  Filled 2017-11-19: qty 1

## 2017-11-19 MED ORDER — CIPROFLOXACIN HCL 500 MG PO TABS
500.0000 mg | ORAL_TABLET | ORAL | Status: AC
  Administered 2017-11-19: 500 mg via ORAL
  Filled 2017-11-19: qty 1

## 2017-11-19 MED ORDER — DIAZEPAM 5 MG PO TABS
10.0000 mg | ORAL_TABLET | ORAL | Status: AC
Start: 2017-11-19 — End: 2017-11-19
  Administered 2017-11-19: 10 mg via ORAL
  Filled 2017-11-19: qty 2

## 2017-11-19 MED ORDER — SODIUM CHLORIDE 0.9 % IV SOLN
INTRAVENOUS | Status: DC
  Administered 2017-11-19: 15:00:00 via INTRAVENOUS

## 2017-11-19 NOTE — Discharge Instructions (Signed)
See [employer] Discharge Instructions

## 2017-11-20 ENCOUNTER — Encounter (HOSPITAL_COMMUNITY): Payer: Self-pay | Admitting: Urology

## 2017-11-23 ENCOUNTER — Other Ambulatory Visit: Payer: Self-pay | Admitting: Urology

## 2017-11-27 ENCOUNTER — Encounter (HOSPITAL_COMMUNITY): Payer: Self-pay

## 2017-11-27 NOTE — Patient Instructions (Signed)
Your procedure is scheduled on: Monday, December 03, 2017   Surgery Time:  12:30PM-2:00PM   Report to [facility] Main  Entrance    Report to admitting at 10:30 AM   Call this number if you have problems the morning of surgery [phone]   Do not eat food:After Midnight.   Do NOT smoke after Midnight   May have liquids until 6:30AM the morning of surgery      CLEAR LIQUID DIET   Foods Allowed                                                                     Foods Excluded  Water, Coffee and tea, regular and decaf                             liquids that you cannot  Plain Jell-O in any flavor                                             see through such as: Fruit ices (not with fruit pulp)                                     milk, soups, orange juice  Iced Popsicles                                    All solid food Carbonated beverages, regular and diet                                    Cranberry, grape and apple juices Sports drinks like Gatorade Lightly seasoned clear broth or consume(fat free) Sugar, honey syrup  Sample Menu Breakfast                                Lunch                                     Supper Cranberry juice                    Beef broth                            Chicken broth Jell-O                                     Grape juice                           Apple juice Coffee or tea  Jell-O                                      Popsicle                                                Coffee or tea                        Coffee or tea    Take these medicines the morning of surgery with A SIP OF WATER: Oxycodone if needed                               You may not have any metal on your body including  jewelry, and body piercings             Do not wear lotions, powders, perfumes/cologne, or deodorant                          Men may shave face and neck.   Do not bring valuables to the hospital. [place].   Contacts, dentures or bridgework may not be worn into surgery.    Patients discharged the day of surgery will not be allowed to drive home.   Special Instructions: Bring a copy of your healthcare power of attorney and living will documents         the day of surgery if you haven't scanned them in before.              Please read over the following fact sheets you were given:  [facility] - Preparing for Surgery Before surgery, you can play an important role.  Because skin is not sterile, your skin needs to be as free of germs as possible.  You can reduce the number of germs on your skin by washing with CHG (chlorahexidine gluconate) soap before surgery.  CHG is an antiseptic cleaner which kills germs and bonds with the skin to continue killing germs even after washing. Please DO NOT use if you have an allergy to CHG or antibacterial soaps.  If your skin becomes reddened/irritated stop using the CHG and inform your nurse when you arrive at Short Stay. Do not shave (including legs and underarms) for at least 48 hours prior to the first CHG shower.  You may shave your face/neck.  Please follow these instructions carefully:  1.  Shower with CHG Soap the night before surgery and the  morning of surgery.  2.  If you choose to wash your hair, wash your hair first as usual with your normal  shampoo.  3.  After you shampoo, rinse your hair and body thoroughly to remove the shampoo.                             4.  Use CHG as you would any other liquid soap.  You can apply chg directly to the skin and wash.  Gently with a scrungie or clean washcloth.  5.  Apply  the CHG Soap to your body ONLY FROM THE NECK DOWN.   Do   not use on face/ open                           Wound or open sores. Avoid contact with eyes, ears mouth and   genitals (private parts).                       Wash face,  Genitals (private parts) with your normal soap.             6.  Wash  thoroughly, paying special attention to the area where your    surgery  will be performed.  7.  Thoroughly rinse your body with warm water from the neck down.  8.  DO NOT shower/wash with your normal soap after using and rinsing off the CHG Soap.                9.  Pat yourself dry with a clean towel.            10.  Wear clean pajamas.            11.  Place clean sheets on your bed the night of your first shower and do not  sleep with pets. Day of Surgery : Do not apply any lotions/deodorants the morning of surgery.  Please wear clean clothes to the hospital/surgery center.  FAILURE TO FOLLOW THESE INSTRUCTIONS MAY RESULT IN THE CANCELLATION OF YOUR SURGERY  PATIENT SIGNATURE_________________________________  NURSE SIGNATURE__________________________________  ________________________________________________________________________   Peter Lin  An incentive spirometer is a tool that can help keep your lungs clear and active. This tool measures how well you are filling your lungs with each breath. Taking long deep breaths may help reverse or decrease the chance of developing breathing (pulmonary) problems (especially infection) following:  A long period of time when you are unable to move or be active. BEFORE THE PROCEDURE   If the spirometer includes an indicator to show your best effort, your nurse or respiratory therapist will set it to a desired goal.  If possible, sit up straight or lean slightly forward. Try not to slouch.  Hold the incentive spirometer in an upright position. INSTRUCTIONS FOR USE  1. Sit on the edge of your bed if possible, or sit up as far as you can in bed or on a chair. 2. Hold the incentive spirometer in an upright position. 3. Breathe out normally. 4. Place the mouthpiece in your mouth and seal your lips tightly around it. 5. Breathe in slowly and as deeply as possible, raising the piston or the ball toward the top of the column. 6. Hold your  breath for 3-5 seconds or for as long as possible. Allow the piston or ball to fall to the bottom of the column. 7. Remove the mouthpiece from your mouth and breathe out normally. 8. Rest for a few seconds and repeat Steps 1 through 7 at least 10 times every 1-2 hours when you are awake. Take your time and take a few normal breaths between deep breaths. 9. The spirometer may include an indicator to show your best effort. Use the indicator as a goal to work toward during each repetition. 10. After each set of 10 deep breaths, practice coughing to be sure your lungs are clear. If you have an incision (the cut made at the time of surgery), support your incision  when coughing by placing a pillow or rolled up towels firmly against it. Once you are able to get out of bed, walk around indoors and cough well. You may stop using the incentive spirometer when instructed by your caregiver.  RISKS AND COMPLICATIONS  Take your time so you do not get dizzy or light-headed.  If you are in pain, you may need to take or ask for pain medication before doing incentive spirometry. It is harder to take a deep breath if you are having pain. AFTER USE  Rest and breathe slowly and easily.  It can be helpful to keep track of a log of your progress. Your caregiver can provide you with a simple table to help with this. If you are using the spirometer at home, follow these instructions: [place] IF:   You are having difficultly using the spirometer.  You have trouble using the spirometer as often as instructed.  Your pain medication is not giving enough relief while using the spirometer.  You develop fever of 100.5 F (38.1 C) or higher. SEEK IMMEDIATE MEDICAL CARE IF:   You cough up bloody sputum that had not been present before.  You develop fever of 102 F (38.9 C) or greater.  You develop worsening pain at or near the incision site. MAKE SURE YOU:   Understand these instructions.  Will watch  your condition.  Will get help right away if you are not doing well or get worse. Document Released: 09/25/2006 Document Revised: 08/07/2011 Document Reviewed: 11/26/2006 [facility] Patient Information 2014 [place], [place].   ________________________________________________________________________

## 2017-11-30 ENCOUNTER — Other Ambulatory Visit: Payer: Self-pay

## 2017-11-30 ENCOUNTER — Encounter (HOSPITAL_COMMUNITY): Payer: Self-pay

## 2017-11-30 ENCOUNTER — Encounter (HOSPITAL_COMMUNITY)
Admission: RE | Admit: 2017-11-30 | Discharge: 2017-11-30 | Disposition: A | Discharge status: Home / Self Care | Payer: Medicare Other | Source: Ambulatory Visit | Attending: Urology | Admitting: Urology

## 2017-11-30 DIAGNOSIS — Z791 Long term (current) use of non-steroidal anti-inflammatories (NSAID): Secondary | ICD-10-CM | POA: Diagnosis not present

## 2017-11-30 DIAGNOSIS — Z79899 Other long term (current) drug therapy: Secondary | ICD-10-CM | POA: Diagnosis not present

## 2017-11-30 DIAGNOSIS — N132 Hydronephrosis with renal and ureteral calculous obstruction: Secondary | ICD-10-CM | POA: Diagnosis not present

## 2017-11-30 DIAGNOSIS — N400 Enlarged prostate without lower urinary tract symptoms: Secondary | ICD-10-CM | POA: Diagnosis not present

## 2017-11-30 DIAGNOSIS — Z8551 Personal history of malignant neoplasm of bladder: Secondary | ICD-10-CM | POA: Diagnosis not present

## 2017-11-30 HISTORY — DX: Personal history of other diseases of the digestive system: Z87.19

## 2017-11-30 HISTORY — DX: Personal history of urinary calculi: Z87.442

## 2017-11-30 HISTORY — DX: Asymptomatic varicose veins of unspecified lower extremity: I83.90

## 2017-11-30 HISTORY — DX: Ventricular premature depolarization: I49.3

## 2017-11-30 LAB — CBC
HCT: 50.3 % (ref 39.0–52.0)
Hemoglobin: 17.1 g/dL — ABNORMAL HIGH (ref 13.0–17.0)
MCH: 32.5 pg (ref 26.0–34.0)
MCHC: 34 g/dL (ref 30.0–36.0)
MCV: 95.6 fL (ref 78.0–100.0)
PLATELETS: 281 10*3/uL (ref 150–400)
RBC: 5.26 MIL/uL (ref 4.22–5.81)
RDW: 13 % (ref 11.5–15.5)
WBC: 5.7 10*3/uL (ref 4.0–10.5)

## 2017-11-30 LAB — BASIC METABOLIC PANEL
Anion gap: 8 (ref 5–15)
BUN: 23 mg/dL (ref 8–23)
CALCIUM: 9.6 mg/dL (ref 8.9–10.3)
CHLORIDE: 106 mmol/L (ref 98–111)
CO2: 29 mmol/L (ref 22–32)
CREATININE: 1.23 mg/dL (ref 0.61–1.24)
GFR, EST NON AFRICAN AMERICAN: 55 mL/min — AB (ref >60)
Glucose, Bld: 87 mg/dL (ref 70–99)
Potassium: 5.7 mmol/L — ABNORMAL HIGH (ref 3.5–5.1)
SODIUM: 143 mmol/L (ref 135–145)

## 2017-11-30 NOTE — Pre-Procedure Instructions (Signed)
CBC and BMP faxed to Dr. Alinda Money via epic.  Dr. Deatra Canter made aware of potassium 5.7, repeat BMP day of surgery. Order placed in epic.

## 2017-12-03 ENCOUNTER — Ambulatory Visit (HOSPITAL_COMMUNITY)
Admission: RE | Admit: 2017-12-03 | Discharge: 2017-12-03 | Disposition: A | Discharge status: Home / Self Care | Payer: Medicare Other | Source: Ambulatory Visit | Attending: Urology | Admitting: Urology

## 2017-12-03 ENCOUNTER — Encounter (HOSPITAL_COMMUNITY)
Admission: RE | Disposition: A | Discharge status: Home / Self Care | Payer: Self-pay | Source: Ambulatory Visit | Attending: Urology

## 2017-12-03 ENCOUNTER — Other Ambulatory Visit: Payer: Self-pay

## 2017-12-03 ENCOUNTER — Ambulatory Visit (HOSPITAL_COMMUNITY): Payer: Medicare Other | Admitting: Registered Nurse

## 2017-12-03 ENCOUNTER — Ambulatory Visit (HOSPITAL_COMMUNITY): Payer: Medicare Other

## 2017-12-03 ENCOUNTER — Encounter (HOSPITAL_COMMUNITY): Payer: Self-pay | Admitting: *Deleted

## 2017-12-03 DIAGNOSIS — N400 Enlarged prostate without lower urinary tract symptoms: Secondary | ICD-10-CM | POA: Diagnosis not present

## 2017-12-03 DIAGNOSIS — N132 Hydronephrosis with renal and ureteral calculous obstruction: Secondary | ICD-10-CM | POA: Diagnosis not present

## 2017-12-03 DIAGNOSIS — Z79899 Other long term (current) drug therapy: Secondary | ICD-10-CM | POA: Insufficient documentation

## 2017-12-03 DIAGNOSIS — Z8551 Personal history of malignant neoplasm of bladder: Secondary | ICD-10-CM | POA: Diagnosis not present

## 2017-12-03 DIAGNOSIS — Z791 Long term (current) use of non-steroidal anti-inflammatories (NSAID): Secondary | ICD-10-CM | POA: Insufficient documentation

## 2017-12-03 HISTORY — PX: CYSTOSCOPY/URETEROSCOPY/HOLMIUM LASER/STENT PLACEMENT: SHX6546

## 2017-12-03 LAB — BASIC METABOLIC PANEL
Anion gap: 10 (ref 5–15)
BUN: 22 mg/dL (ref 8–23)
CALCIUM: 9 mg/dL (ref 8.9–10.3)
CHLORIDE: 108 mmol/L (ref 98–111)
CO2: 25 mmol/L (ref 22–32)
CREATININE: 1.04 mg/dL (ref 0.61–1.24)
GFR calc non Af Amer: >60 mL/min (ref >60)
GLUCOSE: 92 mg/dL (ref 70–99)
Potassium: 4.1 mmol/L (ref 3.5–5.1)
Sodium: 143 mmol/L (ref 135–145)

## 2017-12-03 SURGERY — CYSTOSCOPY/URETEROSCOPY/HOLMIUM LASER/STENT PLACEMENT
Anesthesia: General | Laterality: Bilateral

## 2017-12-03 MED ORDER — IOHEXOL 300 MG/ML  SOLN
INTRAMUSCULAR | Status: DC | PRN
  Administered 2017-12-03: 15 mL

## 2017-12-03 MED ORDER — PHENYLEPHRINE 40 MCG/ML (10ML) SYRINGE FOR IV PUSH (FOR BLOOD PRESSURE SUPPORT)
PREFILLED_SYRINGE | INTRAVENOUS | Status: AC
  Filled 2017-12-03: qty 10

## 2017-12-03 MED ORDER — ONDANSETRON HCL 4 MG/2ML IJ SOLN
INTRAMUSCULAR | Status: DC | PRN
  Administered 2017-12-03: 4 mg via INTRAVENOUS

## 2017-12-03 MED ORDER — PROPOFOL 10 MG/ML IV BOLUS
INTRAVENOUS | Status: DC | PRN
  Administered 2017-12-03: 200 mg via INTRAVENOUS

## 2017-12-03 MED ORDER — PROPOFOL 10 MG/ML IV BOLUS
INTRAVENOUS | Status: AC
  Filled 2017-12-03: qty 20

## 2017-12-03 MED ORDER — DEXAMETHASONE SODIUM PHOSPHATE 10 MG/ML IJ SOLN
INTRAMUSCULAR | Status: DC | PRN
  Administered 2017-12-03: 10 mg via INTRAVENOUS

## 2017-12-03 MED ORDER — FENTANYL CITRATE (PF) 100 MCG/2ML IJ SOLN
INTRAMUSCULAR | Status: AC
  Filled 2017-12-03: qty 2

## 2017-12-03 MED ORDER — LIDOCAINE 2% (20 MG/ML) 5 ML SYRINGE
INTRAMUSCULAR | Status: DC | PRN
  Administered 2017-12-03: 80 mg via INTRAVENOUS

## 2017-12-03 MED ORDER — SODIUM CHLORIDE 0.9 % IR SOLN
Status: DC | PRN
  Administered 2017-12-03: 6000 mL

## 2017-12-03 MED ORDER — OXYCODONE HCL 5 MG PO TABS: 5.0000 mg | ORAL_TABLET | ORAL | 0 refills | Status: AC | PRN

## 2017-12-03 MED ORDER — FENTANYL CITRATE (PF) 100 MCG/2ML IJ SOLN
INTRAMUSCULAR | Status: DC | PRN
  Administered 2017-12-03: 25 ug via INTRAVENOUS
  Administered 2017-12-03: 100 ug via INTRAVENOUS
  Administered 2017-12-03: 25 ug via INTRAVENOUS

## 2017-12-03 MED ORDER — LACTATED RINGERS IV SOLN
INTRAVENOUS | Status: DC
  Administered 2017-12-03: 11:00:00 via INTRAVENOUS

## 2017-12-03 MED ORDER — CEFAZOLIN SODIUM-DEXTROSE 2-4 GM/100ML-% IV SOLN
2.0000 g | Freq: Once | INTRAVENOUS | Status: AC
  Administered 2017-12-03: 2 g via INTRAVENOUS
  Filled 2017-12-03: qty 100

## 2017-12-03 MED ORDER — TAMSULOSIN HCL 0.4 MG PO CAPS: 0.4000 mg | ORAL_CAPSULE | Freq: Every day | ORAL | 0 refills | Status: AC

## 2017-12-03 MED ORDER — ONDANSETRON HCL 4 MG/2ML IJ SOLN
INTRAMUSCULAR | Status: AC
  Filled 2017-12-03: qty 2

## 2017-12-03 SURGICAL SUPPLY — 21 items
BAG URO CATCHER STRL LF (MISCELLANEOUS) ×3 IMPLANT
BASKET ZERO TIP NITINOL 2.4FR (BASKET) ×3 IMPLANT
CATH INTERMIT  6FR 70CM (CATHETERS) ×3 IMPLANT
CLOTH BEACON ORANGE TIMEOUT ST (SAFETY) ×3 IMPLANT
COVER FOOTSWITCH UNIV (MISCELLANEOUS) IMPLANT
COVER SURGICAL LIGHT HANDLE (MISCELLANEOUS) ×3 IMPLANT
FIBER LASER FLEXIVA 365 (UROLOGICAL SUPPLIES) ×3 IMPLANT
FIBER LASER TRAC TIP (UROLOGICAL SUPPLIES) ×3 IMPLANT
GLOVE BIOGEL M STRL SZ7.5 (GLOVE) ×3 IMPLANT
GOWN STRL REUS W/TWL LRG LVL3 (GOWN DISPOSABLE) ×6 IMPLANT
GUIDEWIRE ANG ZIPWIRE 038X150 (WIRE) IMPLANT
GUIDEWIRE STR DUAL SENSOR (WIRE) ×3 IMPLANT
IV NS 1000ML (IV SOLUTION) ×2
IV NS 1000ML BAXH (IV SOLUTION) ×1 IMPLANT
MANIFOLD NEPTUNE II (INSTRUMENTS) ×3 IMPLANT
PACK CYSTO (CUSTOM PROCEDURE TRAY) ×3 IMPLANT
SHEATH URETERAL 12FRX35CM (MISCELLANEOUS) ×3 IMPLANT
STENT URET 6FRX24 CONTOUR (STENTS) ×6 IMPLANT
TUBING CONNECTING 10 (TUBING) ×2 IMPLANT
TUBING CONNECTING 10' (TUBING) ×1
TUBING UROLOGY SET (TUBING) ×3 IMPLANT

## 2017-12-03 NOTE — Anesthesia Postprocedure Evaluation (Signed)
Anesthesia Post Note  Patient: Peter Lin  Procedure(s) Performed: CYSTOSCOPY/URETEROSCOPY/HOLMIUM LASER/STENT PLACEMENT (Bilateral )     Patient location during evaluation: PACU Anesthesia Type: General Level of consciousness: awake and alert Pain management: pain level controlled Vital Signs Assessment: post-procedure vital signs reviewed and stable Respiratory status: spontaneous breathing, nonlabored ventilation, respiratory function stable and patient connected to nasal cannula oxygen Cardiovascular status: blood pressure returned to baseline and stable Postop Assessment: no apparent nausea or vomiting Anesthetic complications: no    Last Vitals:  Vitals:   12/03/17 1456 12/03/17 1535  BP: 129/82 (!) 141/92  Pulse: 72 70  Resp: 14 16  Temp: 36.4 C (!) 36.4 C  SpO2: 100% 100%    Last Pain:  Vitals:   12/03/17 1535  TempSrc:   PainSc: 0-No pain                 Presley Gora DAVID

## 2017-12-03 NOTE — Op Note (Signed)
Preoperative diagnosis: Bilateral ureteral calculi  Postoperative diagnosis: Bilateral ureteral calculi  Procedures: 1.  Cystoscopy 2.  Bilateral retrograde pyelography with interpretation 3.  Left ureteroscopy with laser lithotripsy and stone removal 4.  Right ureteroscopy with stone removal 5.  Bilateral ureteral stent placement (6 x 24 double-J ureteral stents without tether)  Surgeon: Pryor Curia. MD  Anesthesia: General  Complications: None  EBL: Minimal  Intraoperative findings: Left retrograde pyelography demonstrated a filling defect in the mid ureter consistent with a remaining stone fragment without other abnormalities of the ureter or renal collecting system.  Right retrograde pyelography demonstrated a filling defect in the proximal right ureter consistent with the patient's known stone without other abnormalities in the ureter or renal collecting system.  Specimens: Bilateral ureteral calculi  Disposition of specimens: [facility] for analysis  Indication: Peter Lin is a 77 year old gentleman who recently presented with bilateral obstructing ureteral calculi.  He underwent right ureteral stent placement at that time.  An attempted left ureteral stent was unsuccessful.  His left-sided stone appeared to be impacted with no contrast able to bypass the stone located in the proximal ureter.  He then underwent shockwave lithotripsy of his left-sided stone and has passed some fragments.  He presents today for definitive treatment of his right-sided stone and for further evaluation of his left-sided stone.  We discussed proceeding with the above procedures.  The potential risks, complications, and the expected recovery process was discussed in detail.  He gave informed consent.  Description of procedure: The patient was taken to the operating room and a general anesthetic was administered.  He was given preoperative antibiotics, placed in the dorsolithotomy  position, and prepped and draped in the usual sterile fashion.  A preoperative timeout was performed.  Next, a 6 French ureteral catheter was used to intubate the left ureteral orifice and Omnipaque contrast was injected.  This demonstrated a filling defect within the mid ureter consistent with a residual stone fragment.  A 0.38 sensor guidewire was then advanced up into the left renal collecting system under fluoroscopic guidance.  The 6 French semirigid ureteroscope was then advanced next to the wire and the patient's stone fragment was identified.  The 365 m holmium laser fiber was then used to fragment the stone on the setting of 0.6 J and 6 Hz.  Once the stone was adequately fragmented, all fragments were then removed with a 0 tip nitinol basket.  Reinspection of the distal and mid ureter revealed no further fragments.  I then passed the flexible digital ureteroscope up into the renal collecting system and examined the entire renal collecting system as well as the proximal ureter with no further stone fragments identified.  The ureteroscope was then removed and the wire was left in place.  The wire was then backloaded on the cystoscope and a 6 x 24 double-J ureteral stent was advanced over the wire using Seldinger technique and positioned appropriately under fluoroscopic and cystoscopic guidance.  The wire was removed with a good curl noted in the left renal collecting system as well as within the bladder.  Attention then turned to the right side.  The patient's indwelling stent was brought out to the urethral meatus with a flexible grasper.  A 0.38 sensor guidewire was then advanced through the stent up into the renal collecting system under fluoroscopic guidance.  The 6 French semirigid ureteroscope was then advanced next to the wire up to the level of the iliac vessels.  No  stone was identified.  Omnipaque contrast was injected through the ureteroscope and a filling defect was identified within the  proximal ureter.  The semirigid ureteroscope was removed and a 12/14 ureteral access sheath was advanced over the wire to a point below the location of the stone.  The digital flexible ureteroscope was then advanced through the access sheath and the stone was identified.  The stone was able to be manipulated into the proximal aspect of the access sheath was then able to be grasped with a 0 tip nitinol basket and removed without the need for fragmentation.  The ureteroscope was then advanced back up into the renal collecting system no further stone fragments were noted in the renal collecting system or proximal ureter.  The access sheath and ureteroscope were then removed and the wire was left in place.  This was backloaded on the cystoscope and a 6 x 24 double-J ureteral stent was advanced over the wire using Seldinger technique.  It was appropriately positioned under fluoroscopic and cystoscopic guidance.  The wire was removed with a good curl noted in the renal pelvis as well as within the bladder.  The patient's bladder was emptied.  He tolerated the procedure well without complications.  He was able to be awakened and transferred to the recovery unit in satisfactory condition.

## 2017-12-03 NOTE — Anesthesia Preprocedure Evaluation (Signed)
Anesthesia Evaluation  Patient identified by MRN, date of birth, ID band Patient awake    Reviewed: Allergy & Precautions, NPO status , Patient's Chart, lab work & pertinent test results  Airway Mallampati: I  TM Distance: >3 FB Neck ROM: Full    Dental   Pulmonary    Pulmonary exam normal        Cardiovascular Normal cardiovascular exam     Neuro/Psych    GI/Hepatic   Endo/Other    Renal/GU      Musculoskeletal   Abdominal   Peds  Hematology   Anesthesia Other Findings   Reproductive/Obstetrics                             Anesthesia Physical Anesthesia Plan  ASA: II  Anesthesia Plan: General   Post-op Pain Management:    Induction: Intravenous  PONV Risk Score and Plan: 2 and Ondansetron and Treatment may vary due to age or medical condition  Airway Management Planned: Oral ETT  Additional Equipment:   Intra-op Plan:   Post-operative Plan: Extubation in OR  Informed Consent: I have reviewed the patients History and Physical, chart, labs and discussed the procedure including the risks, benefits and alternatives for the proposed anesthesia with the patient or authorized representative who has indicated his/her understanding and acceptance.       Plan Discussed with: CRNA and Surgeon  Anesthesia Plan Comments:         Anesthesia Quick Evaluation  

## 2017-12-03 NOTE — Interval H&P Note (Signed)
History and Physical Interval Note:  12/03/2017 1:03 PM  Peter Lin  has presented today for surgery, with the diagnosis of BILATERAL URETERAL CALCULI  The various methods of treatment have been discussed with the patient and family. After consideration of risks, benefits and other options for treatment, the patient has consented to  Procedure(s): CYSTOSCOPY/URETEROSCOPY/HOLMIUM LASER/STENT PLACEMENT (Bilateral) as a surgical intervention .  The patient's history has been reviewed, patient examined, no change in status, stable for surgery.  I have reviewed the patient's chart and labs.  Questions were answered to the patient's satisfaction.     Ariell Gunnels,LES

## 2017-12-03 NOTE — Discharge Instructions (Addendum)

## 2017-12-03 NOTE — Transfer of Care (Signed)
Immediate Anesthesia Transfer of Care Note  Patient: Peter Lin  Procedure(s) Performed: CYSTOSCOPY/URETEROSCOPY/HOLMIUM LASER/STENT PLACEMENT (Bilateral )  Patient Location: PACU  Anesthesia Type:General  Level of Consciousness: awake, alert , oriented and patient cooperative  Airway & Oxygen Therapy: Patient Spontanous Breathing and Patient connected to face mask oxygen  Post-op Assessment: Report given to RN, Post -op Vital signs reviewed and stable and Patient moving all extremities  Post vital signs: Reviewed and stable  Last Vitals:  Vitals Value Taken Time  BP 135/92 12/03/2017  2:30 PM  Temp    Pulse 75 12/03/2017  2:30 PM  Resp 12 12/03/2017  2:30 PM  SpO2 100 % 12/03/2017  2:30 PM  Vitals shown include unvalidated device data.  Last Pain:  Vitals:   12/03/17 1039  TempSrc:   PainSc: 0-No pain         Complications: No apparent anesthesia complications

## 2017-12-03 NOTE — Anesthesia Procedure Notes (Signed)
Procedure Name: LMA Insertion Date/Time: 12/03/2017 1:16 PM Performed by: Victoriano Lain, CRNA Pre-anesthesia Checklist: Patient identified, Emergency Drugs available, Suction available, Patient being monitored and Timeout performed Patient Re-evaluated:Patient Re-evaluated prior to induction Oxygen Delivery Method: Circle system utilized Preoxygenation: Pre-oxygenation with 100% oxygen Induction Type: IV induction LMA: LMA with gastric port inserted LMA Size: 4.0 Number of attempts: 1 Placement Confirmation: positive ETCO2 and breath sounds checked- equal and bilateral Tube secured with: Tape Dental Injury: Teeth and Oropharynx as per pre-operative assessment

## 2017-12-04 ENCOUNTER — Encounter (HOSPITAL_COMMUNITY): Payer: Self-pay | Admitting: Urology

## 2020-01-14 IMAGING — CT CT RENAL STONE PROTOCOL
2 of 4 series · 16 of 46 positions shown, 18 images · non-contrast
Comparison: None.

CLINICAL DATA: Flank pain with stone disease suspected

EXAM:
CT ABDOMEN AND PELVIS WITHOUT CONTRAST
TECHNIQUE: Multidetector CT imaging of the abdomen and pelvis was performed
following the standard protocol without IV contrast.

[Series 2: axial st · axial · 0.88mm/px · z∈[-561,-131]mm · 13 of 98 slices shown, 15 images]
[im 6/98  soft-tissue]
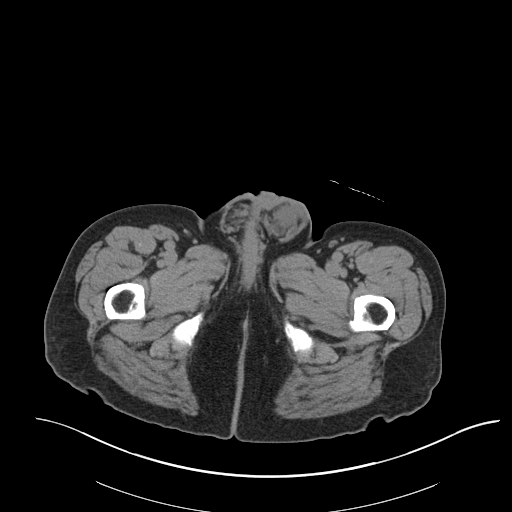
[im 6/98  bone]
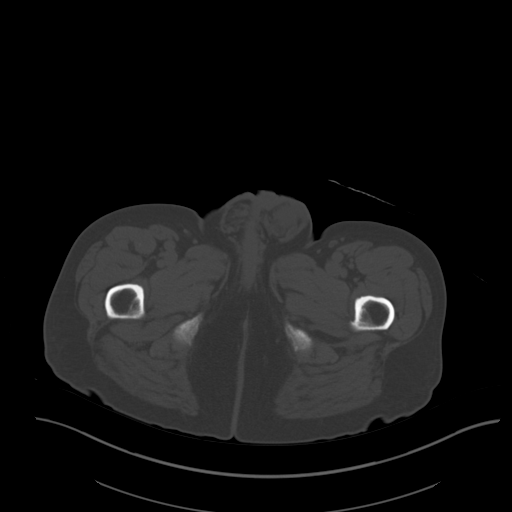
[im 12/98  soft-tissue]
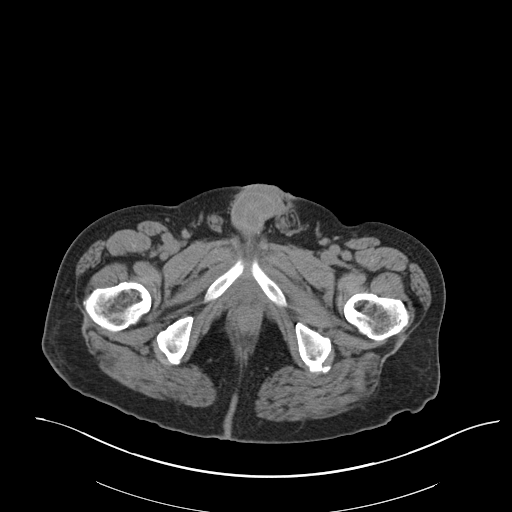
[im 23/98  soft-tissue]
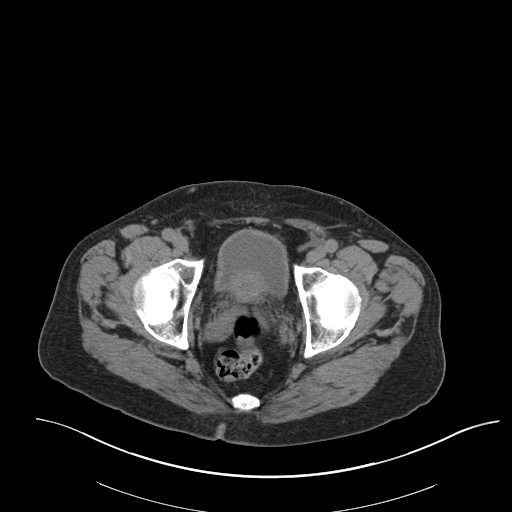
[im 29/98  soft-tissue]
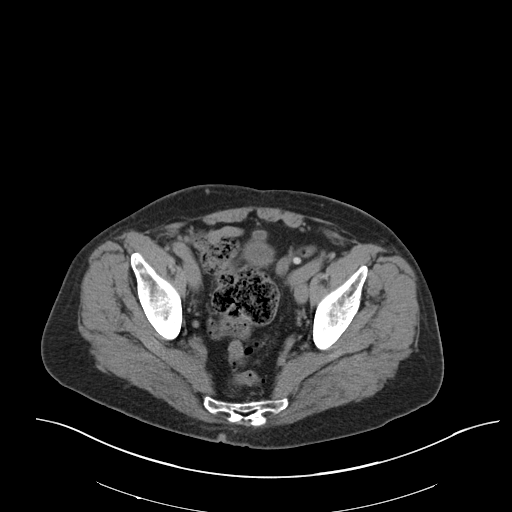
[im 35/98  soft-tissue]
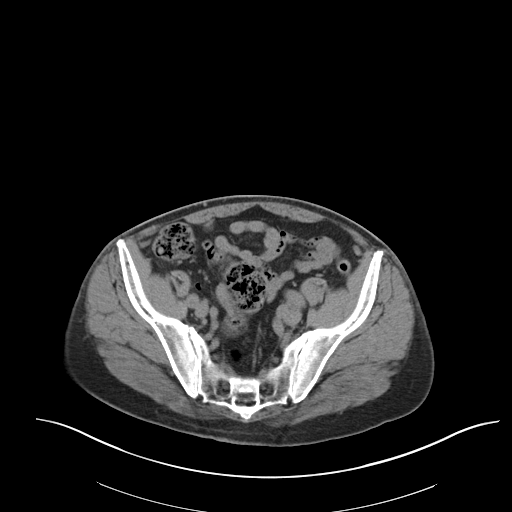
[im 40/98  soft-tissue]
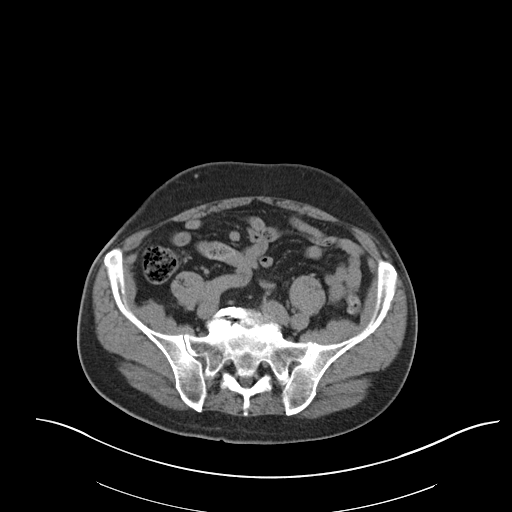
[im 52/98  soft-tissue]
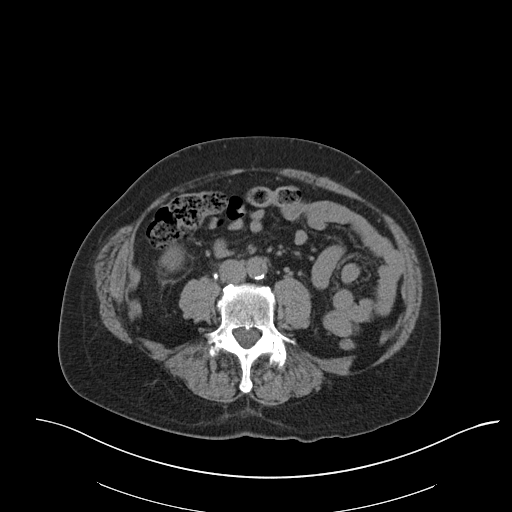
[im 58/98  soft-tissue]
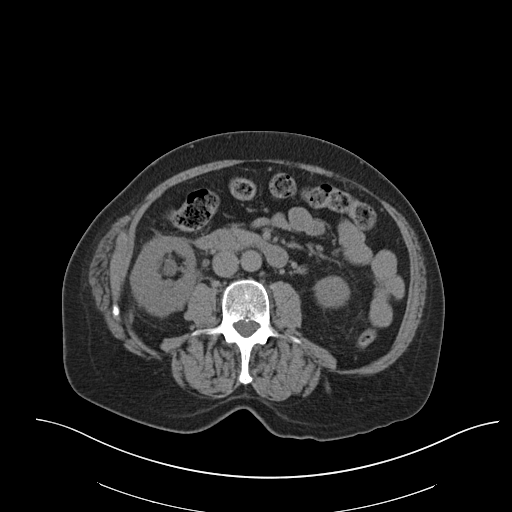
[im 63/98  soft-tissue]
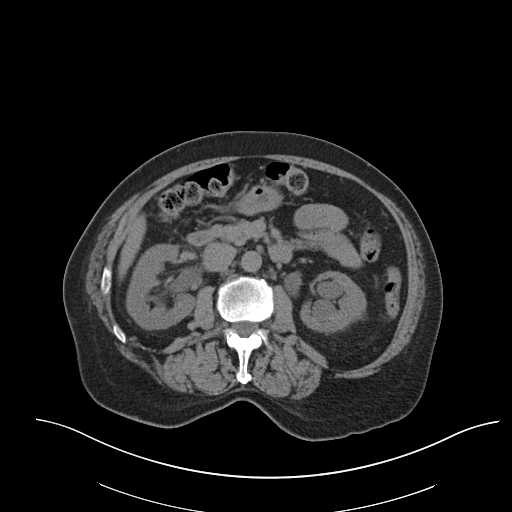
[im 63/98  bone]
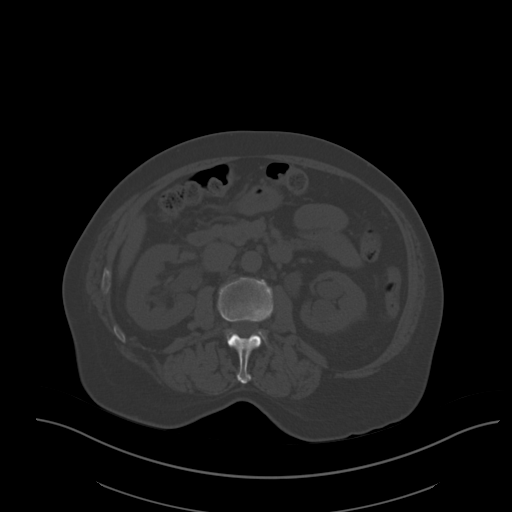
[im 69/98  soft-tissue]
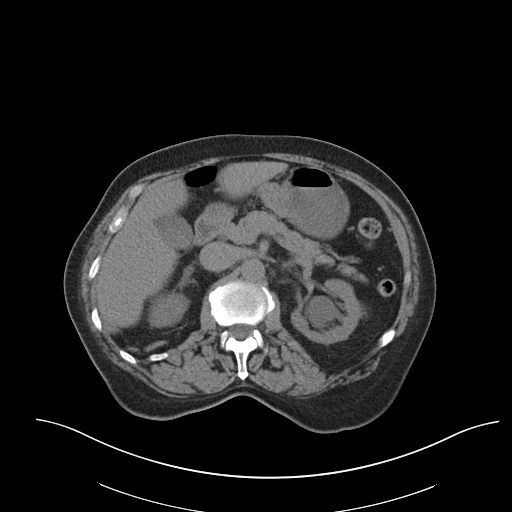
[im 75/98  soft-tissue]
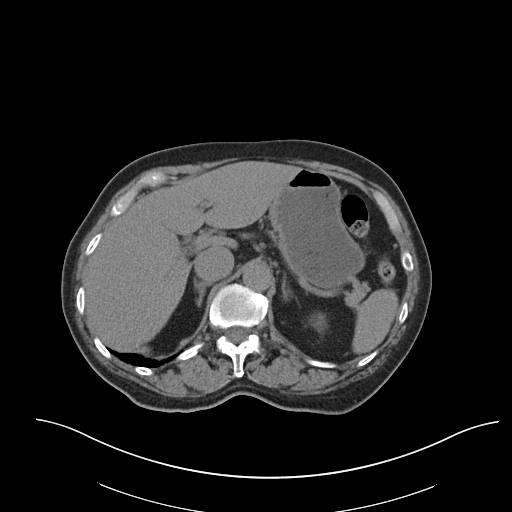
[im 86/98  soft-tissue]
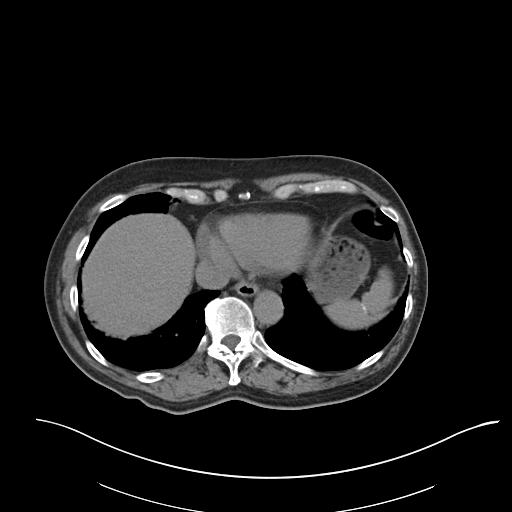
[im 92/98  soft-tissue]
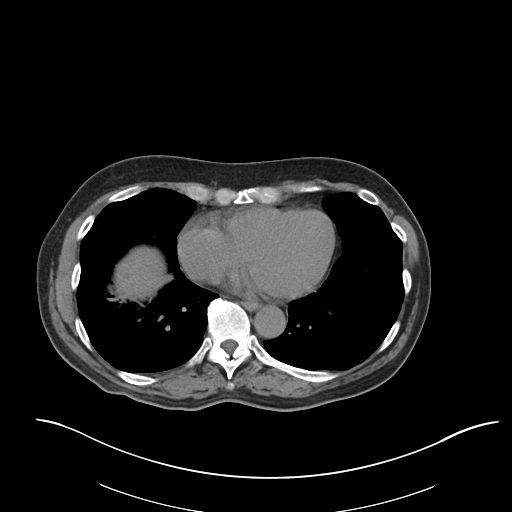

[Series 5: coronal · coronal · 0.73mm/px · 3 of 151 slices shown]
[im 51/151  soft-tissue]
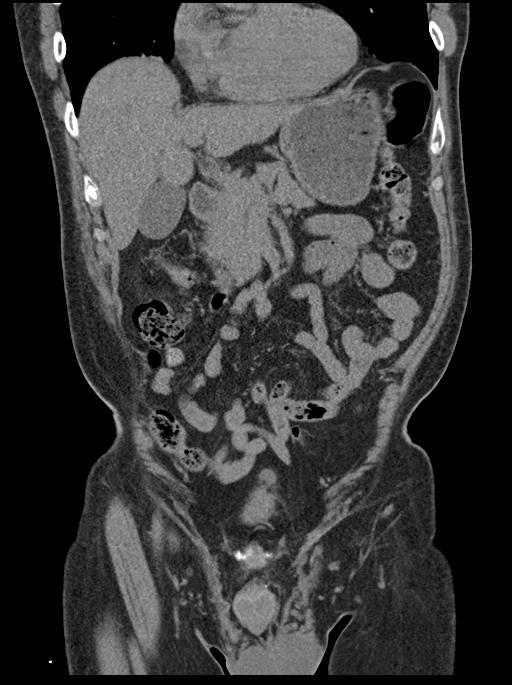
[im 67/151  soft-tissue]
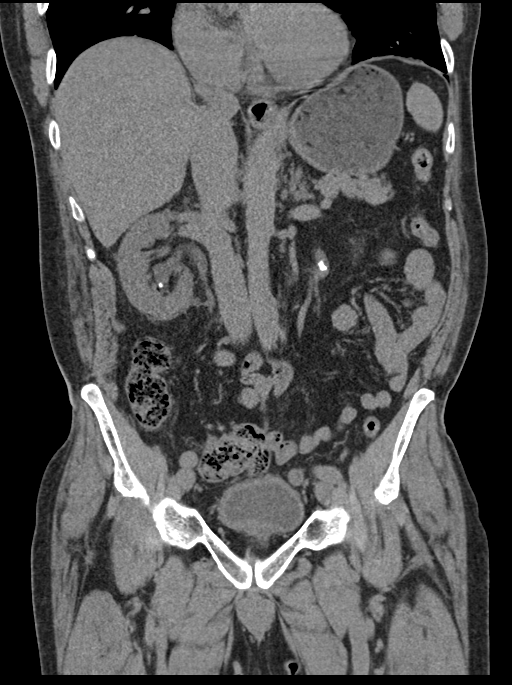
[im 84/151  soft-tissue]
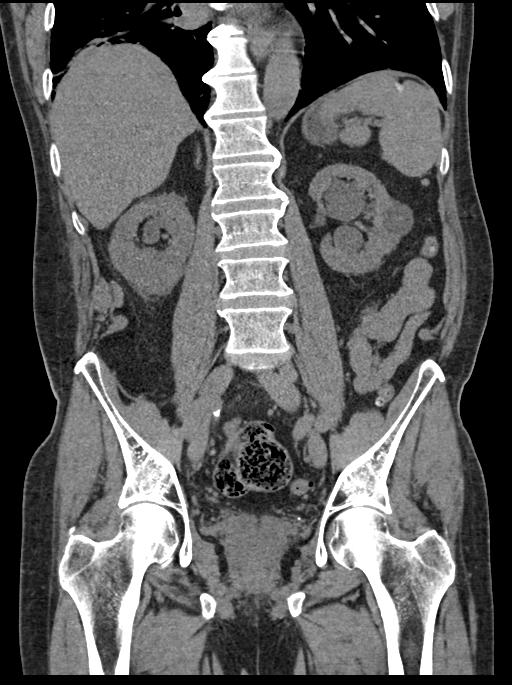

[16 of 46 positions shown; findings below may reference images not displayed]

FINDINGS: Lower chest:  Mild atelectasis at the bases.

Hepatobiliary: No focal liver abnormality.No evidence of biliary
obstruction or stone.

Pancreas: Unremarkable.

Spleen: Unremarkable.

Adrenals/Urinary Tract: Negative adrenals. Left more than right
hydronephrosis above bilateral ureteral stones. Left-sided stone
measures 7 x 4 mm on coronal reformats and is seen at the level of
the L3 vertebra. Right-sided stone measures 6 x 4 mm on reformats
and is seen at the level of the L3-4 disc space. At least 5 right
and 3 left intrarenal calculi, on the left measuring up to 9 mm.
Simple appearing left renal cyst. There is a 11 mm high-density left
upper pole renal lesion that is most likely a hemorrhagic cyst,
solid mass not excluded. Circumferential bladder wall thickening
with at least 2 diverticula.

Stomach/Bowel:  No obstruction. No inflammatory changes.

Vascular/Lymphatic: No acute vascular abnormality. Atherosclerotic
calcification. No mass or adenopathy.

Reproductive:Symmetric prostate enlargement

Other: No ascites or pneumoperitoneum.

Musculoskeletal: No acute abnormalities.
IMPRESSION: 1. Bilateral hydronephrosis due to upper ureteral stones measuring 6
x 4 mm on the right and 7 x 4 mm on the left.
2. Multiple bilateral intrarenal calculi.
3. 11 mm hemorrhagic cyst versus solid mass in the left upper pole.
4. Findings of chronic outlet obstruction above an enlarged prostate
(bladder wall thickening and diverticula). No over distention of the
bladder currently.

## 2020-01-19 IMAGING — CR DG ABDOMEN 1V
2 series · 2 of 2 positions shown · non-contrast
Comparison: 11/15/2017 plain film examination.  11/14/2017 CT.

CLINICAL DATA: 77-year-old male. Preop left ureteral calculus.
Subsequent encounter.

EXAM:
ABDOMEN - 1 VIEW

[t abdomen supine (1 of 2)]
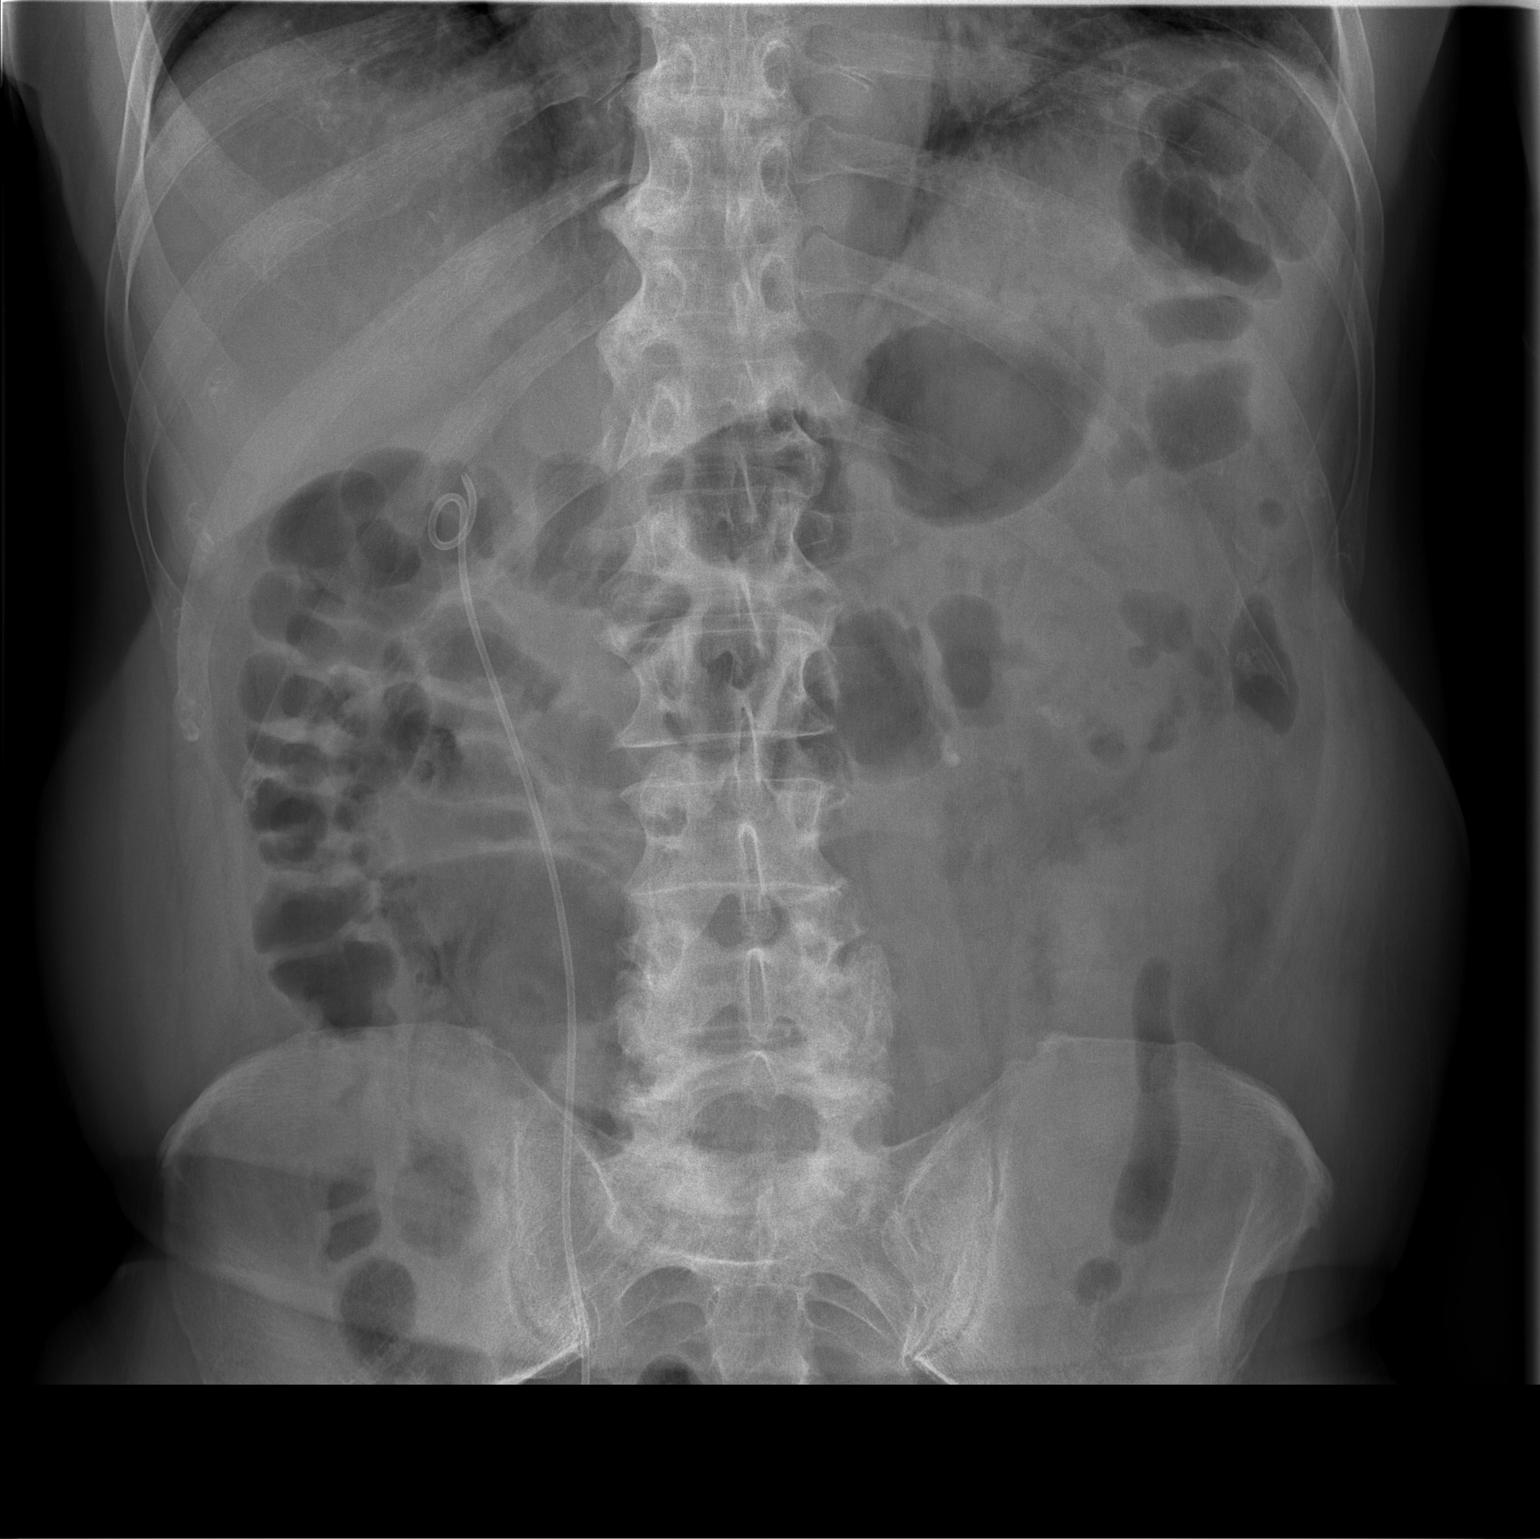

[t abdomen supine (2 of 2)]
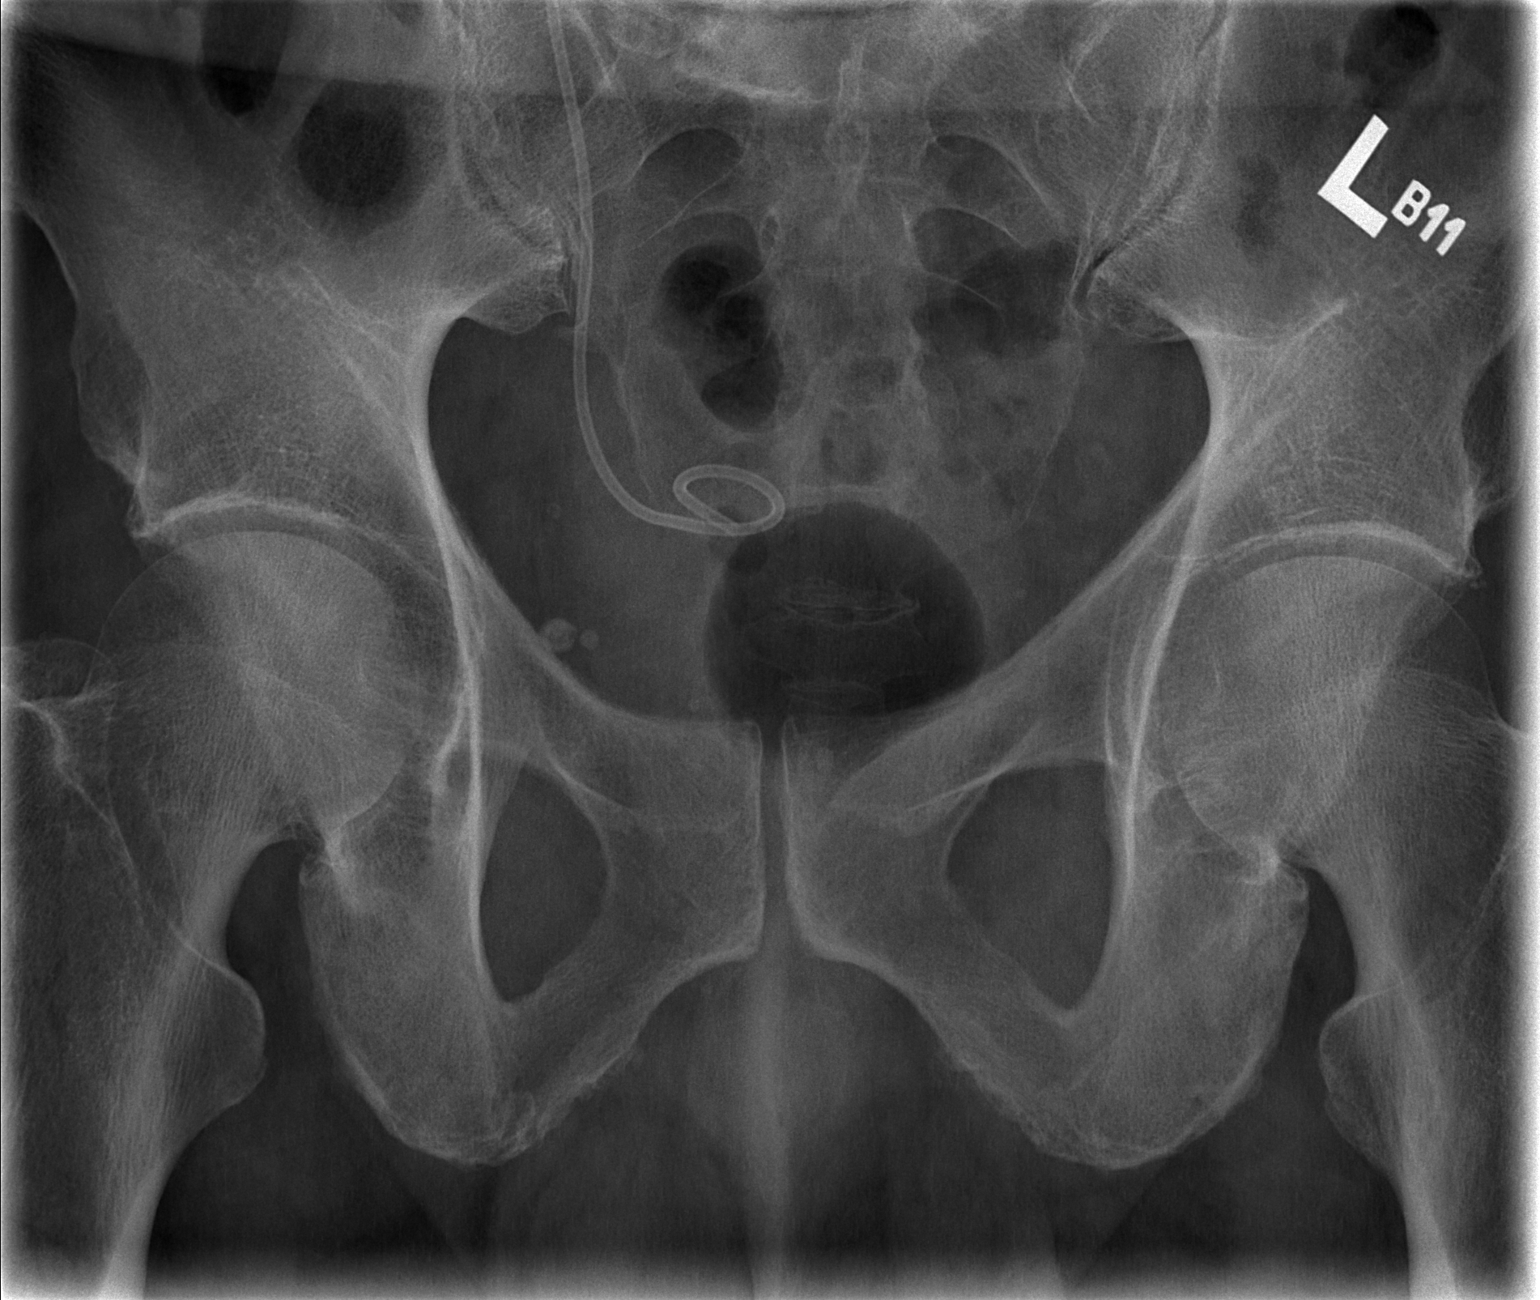

[2 of 2 positions shown; findings below may reference images not displayed]

FINDINGS: Right-sided double-J stent is in place.

8 x 4 mm proximal left ureteral calculus.

Bilateral renal calculi.

Degenerative changes lumbar spine.

Normal bowel gas pattern.
IMPRESSION: Right-sided double-J stent is in place.

8 x 4 mm proximal left ureteral calculus.

Bilateral renal calculi.
